# Patient Record
Sex: Female | Born: 1995 | Race: White | Hispanic: No | Marital: Single | Smoking: Never smoker
Health system: Southern US, Community
[De-identification: ages and names within clinical notes are randomized; demographics above are authoritative.]

## PROBLEM LIST (undated history)

## (undated) DIAGNOSIS — J45909 Unspecified asthma, uncomplicated: Secondary | ICD-10-CM

## (undated) DIAGNOSIS — E559 Vitamin D deficiency, unspecified: Secondary | ICD-10-CM

## (undated) DIAGNOSIS — I10 Essential (primary) hypertension: Secondary | ICD-10-CM

## (undated) DIAGNOSIS — E785 Hyperlipidemia, unspecified: Secondary | ICD-10-CM

## (undated) DIAGNOSIS — G43909 Migraine, unspecified, not intractable, without status migrainosus: Secondary | ICD-10-CM

## (undated) HISTORY — DX: Migraine, unspecified, not intractable, without status migrainosus: G43.909

## (undated) HISTORY — DX: Hyperlipidemia, unspecified: E78.5

## (undated) HISTORY — DX: Vitamin D deficiency, unspecified: E55.9

## (undated) HISTORY — DX: Essential (primary) hypertension: I10

## (undated) HISTORY — PX: WISDOM TOOTH EXTRACTION: SHX21

## (undated) HISTORY — DX: Unspecified asthma, uncomplicated: J45.909

---
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
## 2015-01-20 ENCOUNTER — Ambulatory Visit (INDEPENDENT_AMBULATORY_CARE_PROVIDER_SITE_OTHER): Payer: Medicaid Other | Admitting: *Deleted

## 2015-01-20 ENCOUNTER — Encounter: Payer: Self-pay | Admitting: *Deleted

## 2015-01-20 DIAGNOSIS — Z3202 Encounter for pregnancy test, result negative: Secondary | ICD-10-CM | POA: Diagnosis not present

## 2015-01-20 DIAGNOSIS — Z3042 Encounter for surveillance of injectable contraceptive: Secondary | ICD-10-CM | POA: Diagnosis not present

## 2015-01-20 LAB — POCT URINE PREGNANCY: Preg Test, Ur: NEGATIVE

## 2015-01-20 MED ORDER — MEDROXYPROGESTERONE ACETATE 150 MG/ML IM SUSP
150.0000 mg | Freq: Once | INTRAMUSCULAR | Status: AC
  Administered 2015-01-20: 150 mg via INTRAMUSCULAR

## 2015-01-20 NOTE — Progress Notes (Signed)
Pt here for Depo. Pt reports no problems at this time. Return in 12 weeks for next shot. JSY 

## 2015-04-14 ENCOUNTER — Ambulatory Visit (INDEPENDENT_AMBULATORY_CARE_PROVIDER_SITE_OTHER): Payer: Medicaid Other | Admitting: *Deleted

## 2015-04-14 ENCOUNTER — Encounter: Payer: Self-pay | Admitting: *Deleted

## 2015-04-14 DIAGNOSIS — Z3042 Encounter for surveillance of injectable contraceptive: Secondary | ICD-10-CM | POA: Diagnosis not present

## 2015-04-14 DIAGNOSIS — Z3202 Encounter for pregnancy test, result negative: Secondary | ICD-10-CM

## 2015-04-14 LAB — POCT URINE PREGNANCY: Preg Test, Ur: NEGATIVE

## 2015-04-14 MED ORDER — MEDROXYPROGESTERONE ACETATE 150 MG/ML IM SUSP
150.0000 mg | Freq: Once | INTRAMUSCULAR | Status: AC
  Administered 2015-04-14: 150 mg via INTRAMUSCULAR

## 2015-04-14 NOTE — Progress Notes (Signed)
Pt here for Depo. Reports no problems at this time. Return in 12 weeks for next shot. JSY 

## 2015-04-21 ENCOUNTER — Ambulatory Visit (HOSPITAL_COMMUNITY): Payer: Medicaid Other | Admitting: Physical Therapy

## 2015-05-07 ENCOUNTER — Ambulatory Visit (HOSPITAL_COMMUNITY): Payer: Medicaid Other | Attending: Sports Medicine | Admitting: Physical Therapy

## 2015-05-07 DIAGNOSIS — M545 Low back pain, unspecified: Secondary | ICD-10-CM

## 2015-05-07 DIAGNOSIS — R29898 Other symptoms and signs involving the musculoskeletal system: Secondary | ICD-10-CM | POA: Insufficient documentation

## 2015-05-07 NOTE — Therapy (Signed)
[facility] [address] [place], [place], [address] Phone: [phone]   Fax:  [phone]  Physical Therapy Evaluation  Patient Details  Name: Chelcee Korpi MRN: [id number] Date of Birth: 02-May-1996 Referring Provider:  Waldon Reining, MD  Encounter Date: 05/07/2015      PT End of Session - 05/07/15 0837    Visit Number 1   Number of Visits 8   Date for PT Re-Evaluation 06/06/15   Authorization Type medicaid   PT Start Time 0805   PT Stop Time 0840   PT Time Calculation (min) 35 min   Activity Tolerance Patient tolerated treatment well      Past Medical History  Diagnosis Date  . Asthma   . Migraine   . Hypertension   . Hyperlipidemia     No past surgical history on file.  There were no vitals filed for this visit.  Visit Diagnosis:  Bilateral low back pain without sciatica  Left leg weakness      Subjective Assessment - 05/07/15 0807    Subjective Ms. Tufano states that she has had low back pain for about a year now.  The Pain has been progressing and is greater on the Right side.  She complains of numbing in the back of her legs B into the upper thigh.  She has had x-ray which was negative.  She is being referred to PT for evaluation.    How long can you sit comfortably? no problem     How long can you stand comfortably? 10-15 minutes    How long can you walk comfortably? able to walk for an hour or longer    Currently in Pain? Yes   Pain Score 5    Pain Location Back   Pain Descriptors / Indicators Dull   Pain Onset More than a month ago   Pain Frequency Intermittent            [facility] PT Assessment - 05/07/15 0001    Assessment   Medical Diagnosis Low back pain   Onset Date/Surgical Date 03/12/15   Prior Therapy none    Precautions   Precautions None   Restrictions   Weight Bearing Restrictions No   Balance Screen   Has the patient fallen in the past 6 months No   Has the patient had a decrease  in activity level because of a fear of falling?  No   Is the patient reluctant to leave their home because of a fear of falling?  No   Home Environment   Living Environment Private residence   Home Access Level entry   Prior Function   Level of Independence Independent   Warden/ranger;Unemployed   Leisure dance,    Cognition   Overall Cognitive Status Within Functional Limits for tasks assessed   Functional Tests   Functional tests Single leg stance   Single Leg Stance   Comments Rt 41 ; Lt 53   ROM / Strength   AROM / PROM / Strength AROM;Strength   AROM   AROM Assessment Site Lumbar   Lumbar Flexion 110 reps no change   Lumbar Extension 22 reps no change    Lumbar - Right Side Bend 40 no change    Lumbar - Left Side Bend 45 worse with reps    Lumbar - Right Rotation wnl   Lumbar - Left Rotation wnl    Strength   Strength Assessment Site Hip;Knee;Ankle   Right/Left Hip Right;Left  Right Hip Flexion 5/5   Right Hip Extension 4+/5   Right Hip ABduction 5/5   Left Hip Flexion 5/5   Left Hip Extension 3+/5   Left Hip ABduction 5/5   Right/Left Knee Right;Left   Right Knee Flexion 4+/5   Right Knee Extension 5/5   Left Knee Flexion 4/5   Left Knee Extension 5/5   Right/Left Ankle --  wnl B                    [facility] Adult PT Treatment/Exercise - 05/07/15 0001    Exercises   Exercises Lumbar   Lumbar Exercises: Stretches   Active Hamstring Stretch 2 reps;30 seconds   Single Knee to Chest Stretch 2 reps;30 seconds   Pelvic Tilt 5 reps   Lumbar Exercises: Prone   Straight Leg Raise 10 reps   Other Prone Lumbar Exercises heel squeeze x5                 PT Education - 05/07/15 0837    Education provided Yes   Education Details HEP    Person(s) Educated Patient   Methods Explanation;Verbal cues;Handout   Comprehension Verbalized understanding;Returned demonstration          PT Short Term Goals - 05/07/15 0843    PT SHORT TERM GOAL #1    Title I HEP    Time 1   Period Weeks   PT SHORT TERM GOAL #2   Title Pt pain to be no greater than a 5/10    Time 2   Period Weeks           PT Long Term Goals - 05/07/15 0843    PT LONG TERM GOAL #1   Title I in advance HEP   Time 4   Period Weeks   PT LONG TERM GOAL #2   Title Pt to be able to stand for 30 mintues without difficutly for socializing    Time 4   Period Weeks   PT LONG TERM GOAL #3   Title Pt LE strength to be wnl; core ot be 4/5 to allow pain level to be no greater than a 2/10   Time 4   Period Weeks               Plan - 05/07/15 9604    Clinical Impression Statement Ms. Ige is an28 yo female who has had progressive back pain.  She states that it now can go as high as a 8 or 9 therefore she sought medical attention.  Evaluation demonstrates decreased knowledge of body mechanics, decreased Lt LE and core strength.  Ms. Crownover will benefit from skilled physical therapy to educate in an advance HEP to decrease her sx of pain, improve her quality of life and maximize her functioning ability.    Pt will benefit from skilled therapeutic intervention in order to improve on the following deficits Decreased activity tolerance;Decreased strength;Pain;Improper body mechanics   Rehab Potential Good   PT Frequency 2x / week   PT Duration 4 weeks   PT Treatment/Interventions ADLs/Self Care Home Management;Therapeutic activities;Functional mobility training;Therapeutic exercise;Patient/family education;Balance training   PT Next Visit Plan begin single arm raise, prone shoulder extension, opposite arm/leg raise; t-band postural exercises progress to quadriped, yoga poses and lunges.    PT Home Exercise Plan given   Consulted and Agree with Plan of Care Patient         Problem List There are no active problems to display  for this patient. Virgina Organ, PT CLT [phone] 05/07/2015, 8:46 AM  [facility] [address] [place], [place], [address] Phone: [phone]   Fax:  [phone]

## 2015-05-07 NOTE — Patient Instructions (Signed)
Knee-to-Chest Stretch: Unilateral   With hand behind right knee, pull knee in to chest until a comfortable stretch is felt in lower back and buttocks. Keep back relaxed. Hold __30__ seconds. Repeat _3___ times per set. Do __1__ sets per session. Do ___2_ sessions per day.  http://orth.exer.us/126   Copyright  VHI. All rights reserved.  Hamstring Stretch: Active   Support behind right knee. Starting with knee bent, attempt to straighten knee until a comfortable stretch is felt in back of thigh. Hold ___30_ seconds. Repeat __3__ times per set. Do ___1_ sets per session. Do __2__ sessions per day.  http://orth.exer.us/158   Copyright  VHI. All rights reserved.  Pelvic Tilt   Flatten back by tightening stomach muscles and buttocks. Repeat _10___ times per set. Do __1__ sets per session. Do _1___ sessions per day.  http://orth.exer.us/134   Copyright  VHI. All rights reserved.  Heel Squeeze (Prone)   Abdomen supported, bend knees and gently squeeze heels together. Hold ___3-5_ seconds. Repeat _10___ times per set. Do __1__ sets per session. Do _2___ sessions per day.  http://orth.exer.us/1080   Copyright  VHI. All rights reserved.  Straight Leg Raise (Prone)   Abdomen and head supported, keep left knee locked and raise leg at hip. Avoid arching low back. Repeat _10___ times per set. Do _1___ sets per session. Do _2___ sessions per day.  http://orth.exer.us/1112   Copyright  VHI. All rights reserved.

## 2015-05-10 ENCOUNTER — Telehealth (HOSPITAL_COMMUNITY): Payer: Self-pay

## 2015-05-10 ENCOUNTER — Ambulatory Visit (HOSPITAL_COMMUNITY): Payer: Medicaid Other

## 2015-05-10 NOTE — Telephone Encounter (Signed)
No show, called and left message informing missed apt today, informed next apt date and time and contact info.  [address] LPTA; CBIS [phone]

## 2015-05-18 ENCOUNTER — Telehealth (HOSPITAL_COMMUNITY): Payer: Self-pay

## 2015-05-18 ENCOUNTER — Ambulatory Visit (HOSPITAL_COMMUNITY): Payer: Medicaid Other

## 2015-05-18 NOTE — Telephone Encounter (Signed)
Mother said she is sick and can not come in today

## 2015-05-20 ENCOUNTER — Ambulatory Visit (HOSPITAL_COMMUNITY): Payer: Medicaid Other | Attending: Sports Medicine | Admitting: Physical Therapy

## 2015-05-20 DIAGNOSIS — M545 Low back pain, unspecified: Secondary | ICD-10-CM

## 2015-05-20 DIAGNOSIS — R29898 Other symptoms and signs involving the musculoskeletal system: Secondary | ICD-10-CM | POA: Insufficient documentation

## 2015-05-20 NOTE — Therapy (Signed)
[facility] [address] [place], [place], [address] Phone: [phone]   Fax:  [phone]  Physical Therapy Treatment  Patient Details  Name: Crystal Berg MRN: [id number] Date of Birth: [birth year] Referring Provider:  [facility]*  Encounter Date: 05/20/2015      PT End of Session - 05/20/15 1623    Visit Number 2   Number of Visits 8   Date for PT Re-Evaluation 06/06/15   Authorization Type medicaid   PT Start Time 1540   PT Stop Time 1620   PT Time Calculation (min) 40 min   Activity Tolerance Patient tolerated treatment well   Behavior During Therapy [facility] for tasks assessed/performed      Past Medical History  Diagnosis Date  . Asthma   . Migraine   . Hypertension   . Hyperlipidemia     No past surgical history on file.  There were no vitals filed for this visit.  Visit Diagnosis:  Bilateral low back pain without sciatica  Left leg weakness      Subjective Assessment - 05/20/15 1631    Subjective Pt states she has been sick and unable to make her appointments.  PT states her back feels tight like she needs to pop it.  Reports pain of 6/10 when entered and states rarely gets under 5/10.    Currently in Pain? Yes   Pain Score 6    Pain Location Back                         [facility] Adult PT Treatment/Exercise - 05/20/15 1549    Lumbar Exercises: Stretches   Active Hamstring Stretch 2 reps;30 seconds   Single Knee to Chest Stretch 2 reps;30 seconds   Pelvic Tilt Limitations   Pelvic Tilt Limitations 10 reps   Lumbar Exercises: Standing   Scapular Retraction 10 reps;Theraband   Theraband Level (Scapular Retraction) Level 3 (Green)   Row 10 reps;Theraband   Theraband Level (Row) Level 3 (Green)   Shoulder Extension 10 reps;Theraband   Theraband Level (Shoulder Extension) Level 3 (Green)   Lumbar Exercises: Supine   Bridge 10 reps   Straight Leg Raise 10 reps   Lumbar Exercises:  Prone   Single Arm Raise 10 reps   Straight Leg Raise 15 reps   Opposite Arm/Leg Raise 10 reps   Other Prone Lumbar Exercises heel squeeze 15x5                 PT Education - 05/20/15 1623    Education provided Yes   Education Details form and corrections with HEP, new exercise instructions.  Given written copy of HEP with instructions of goals.   Methods Explanation;Demonstration;Handout;Verbal cues   Comprehension Verbalized understanding;Need further instruction          PT Short Term Goals - 05/07/15 0843    PT SHORT TERM GOAL #1   Title I HEP    Time 1   Period Weeks   PT SHORT TERM GOAL #2   Title Pt pain to be no greater than a 5/10    Time 2   Period Weeks           PT Long Term Goals - 05/07/15 0843    PT LONG TERM GOAL #1   Title I in advance HEP   Time 4   Period Weeks   PT LONG TERM GOAL #2   Title Pt to be  able to stand for 30 mintues without difficutly for socializing    Time 4   Period Weeks   PT LONG TERM GOAL #3   Title Pt LE strength to be wnl; core ot be 4/5 to allow pain level to be no greater than a 2/10   Time 4   Period Weeks               Plan - 05/20/15 1624    Clinical Impression Statement Evaluation distributed to patient with review of goals.  Progressed therex per therapist POC.  Pt able to recall 3/5 exercises instructed for HEP.  Pt required therapist facilitation with all therex to complete correctly.  Audible popping in Low back heard with trunk rotation in standing.     PT Next Visit Plan Add  prone shoulder extension next session.  Progress to quadriped, yoga poses and lunges.    PT Home Exercise Plan given   Consulted and Agree with Plan of Care Patient        Problem List There are no active problems to display for this patient.   Lurena Nida, PTA/CLT [phone]  05/20/2015, 4:38 PM  [facility] [address] [place], [place], [address] Phone:  [phone]   Fax:  [phone]

## 2015-05-25 ENCOUNTER — Ambulatory Visit (HOSPITAL_COMMUNITY): Payer: Medicaid Other | Admitting: Physical Therapy

## 2015-05-25 ENCOUNTER — Telehealth (HOSPITAL_COMMUNITY): Payer: Self-pay | Admitting: Physical Therapy

## 2015-05-25 NOTE — Telephone Encounter (Signed)
Patient a no-show for today's appointment. Called and left message detailing time and date of next appointment.   Kristen Unger PT, DPT [phone]  

## 2015-05-27 ENCOUNTER — Ambulatory Visit (HOSPITAL_COMMUNITY): Payer: Medicaid Other | Admitting: Physical Therapy

## 2015-05-27 DIAGNOSIS — M545 Low back pain, unspecified: Secondary | ICD-10-CM

## 2015-05-27 DIAGNOSIS — R29898 Other symptoms and signs involving the musculoskeletal system: Secondary | ICD-10-CM

## 2015-05-27 NOTE — Therapy (Signed)
[facility] [address] [place], [place], [address] Phone: [phone]   Fax:  [phone]  Physical Therapy Treatment  Patient Details  Name: Crystal Berg MRN: [id number] Date of Birth: 1996-08-21 Referring Provider:  [facility]*  Encounter Date: 05/27/2015      PT End of Session - 05/27/15 1640    Visit Number 3   Number of Visits 8   Date for PT Re-Evaluation 06/06/15   Authorization Type medicaid   PT Start Time 1604   PT Stop Time 1648   PT Time Calculation (min) 44 min   Activity Tolerance Patient tolerated treatment well   Behavior During Therapy WFL for tasks assessed/performed      Past Medical History  Diagnosis Date  . Asthma   . Migraine   . Hypertension   . Hyperlipidemia     No past surgical history on file.  There were no vitals filed for this visit.  Visit Diagnosis:  No diagnosis found.      Subjective Assessment - 05/27/15 1614    Subjective Pt states she was in a senior meeting last appointment and could not leave and thats why she didn't make her appointment.  Pt states she has been walking 30 minutes every other day and is currently without pain.  Pt states she did have some pain last night when she layed down but not when she was up moving.    Currently in Pain? No/denies                         [facility] Adult PT Treatment/Exercise - 05/27/15 1616    Lumbar Exercises: Stretches   Active Hamstring Stretch 2 reps;30 seconds   Single Knee to Chest Stretch 2 reps;30 seconds   Lumbar Exercises: Standing   Forward Lunge 10 reps   Forward Lunge Limitations 6" box 1 UE   Side Lunge 10 reps;Limitations   Side Lunge Limitations 6" box 1 UE   Scapular Retraction 10 reps;Theraband   Theraband Level (Scapular Retraction) Level 3 (Green)   Row 10 reps;Theraband   Theraband Level (Row) Level 3 (Green)   Shoulder Extension 10 reps;Theraband   Theraband Level (Shoulder  Extension) Level 3 (Green)   Lumbar Exercises: Supine   Bridge 15 reps   Straight Leg Raise 15 reps   Lumbar Exercises: Prone   Single Arm Raise 10 reps   Straight Leg Raise 10 reps   Opposite Arm/Leg Raise 10 reps   Other Prone Lumbar Exercises heel squeeze 15x5    Other Prone Lumbar Exercises shoulder extensions 10 reps                  PT Short Term Goals - 05/07/15 0843    PT SHORT TERM GOAL #1   Title I HEP    Time 1   Period Weeks   PT SHORT TERM GOAL #2   Title Pt pain to be no greater than a 5/10    Time 2   Period Weeks           PT Long Term Goals - 05/07/15 0843    PT LONG TERM GOAL #1   Title I in advance HEP   Time 4   Period Weeks   PT LONG TERM GOAL #2   Title Pt to be able to stand for 30 mintues without difficutly for socializing    Time 4   Period Weeks  PT LONG TERM GOAL #3   Title Pt LE strength to be wnl; core ot be 4/5 to allow pain level to be no greater than a 2/10   Time 4   Period Weeks               Plan - 05/27/15 1642    Clinical Impression Statement Pt without pain today.  Able to complete all exericises without c/o pain or overage of verbal cues.  No audible popping heard in low back this session.  Able to increase reps and added prone shoulder extension and lunges to POC today.    PT Next Visit Plan Progress prone exercises to quadriped and begin yoga poses.   PT Home Exercise Plan given   Consulted and Agree with Plan of Care Patient        Problem List There are no active problems to display for this patient.   Lurena Nida, PTA/CLT [phone]  05/27/2015, 4:49 PM  [facility] [address] [place], [place], [address] Phone: [phone]   Fax:  [phone]

## 2015-06-01 ENCOUNTER — Ambulatory Visit (HOSPITAL_COMMUNITY): Payer: Medicaid Other | Admitting: Physical Therapy

## 2015-06-10 ENCOUNTER — Encounter (HOSPITAL_COMMUNITY): Payer: Medicaid Other | Admitting: Physical Therapy

## 2015-07-07 ENCOUNTER — Ambulatory Visit: Payer: Medicaid Other

## 2015-07-08 ENCOUNTER — Ambulatory Visit (INDEPENDENT_AMBULATORY_CARE_PROVIDER_SITE_OTHER): Payer: Medicaid Other | Admitting: *Deleted

## 2015-07-08 ENCOUNTER — Encounter: Payer: Self-pay | Admitting: *Deleted

## 2015-07-08 DIAGNOSIS — Z3202 Encounter for pregnancy test, result negative: Secondary | ICD-10-CM | POA: Diagnosis not present

## 2015-07-08 DIAGNOSIS — Z3042 Encounter for surveillance of injectable contraceptive: Secondary | ICD-10-CM

## 2015-07-08 LAB — POCT URINE PREGNANCY: Preg Test, Ur: NEGATIVE

## 2015-07-08 MED ORDER — MEDROXYPROGESTERONE ACETATE 150 MG/ML IM SUSP
150.0000 mg | Freq: Once | INTRAMUSCULAR | Status: AC
  Administered 2015-07-08: 150 mg via INTRAMUSCULAR

## 2015-07-08 NOTE — Progress Notes (Signed)
Pt here for Depo. Reports no problems at this time. Return in 12 weeks for next shot. JSY 

## 2015-09-30 ENCOUNTER — Encounter: Payer: Self-pay | Admitting: *Deleted

## 2015-09-30 ENCOUNTER — Ambulatory Visit (INDEPENDENT_AMBULATORY_CARE_PROVIDER_SITE_OTHER): Payer: Medicaid Other | Admitting: *Deleted

## 2015-09-30 DIAGNOSIS — Z3202 Encounter for pregnancy test, result negative: Secondary | ICD-10-CM

## 2015-09-30 DIAGNOSIS — Z3042 Encounter for surveillance of injectable contraceptive: Secondary | ICD-10-CM

## 2015-09-30 LAB — POCT URINE PREGNANCY: Preg Test, Ur: NEGATIVE

## 2015-09-30 MED ORDER — MEDROXYPROGESTERONE ACETATE 150 MG/ML IM SUSP
150.0000 mg | Freq: Once | INTRAMUSCULAR | Status: AC
  Administered 2015-09-30: 150 mg via INTRAMUSCULAR

## 2015-09-30 NOTE — Progress Notes (Signed)
Pt here for Depo. Pt reports no problems at this time. Return in 12 weeks for next shot. JSY 

## 2015-12-22 ENCOUNTER — Other Ambulatory Visit: Payer: Self-pay | Admitting: Adult Health

## 2015-12-23 ENCOUNTER — Ambulatory Visit (INDEPENDENT_AMBULATORY_CARE_PROVIDER_SITE_OTHER): Payer: Medicaid Other | Admitting: *Deleted

## 2015-12-23 ENCOUNTER — Encounter: Payer: Self-pay | Admitting: *Deleted

## 2015-12-23 DIAGNOSIS — Z3202 Encounter for pregnancy test, result negative: Secondary | ICD-10-CM

## 2015-12-23 DIAGNOSIS — Z3042 Encounter for surveillance of injectable contraceptive: Secondary | ICD-10-CM | POA: Diagnosis not present

## 2015-12-23 LAB — POCT URINE PREGNANCY: PREG TEST UR: NEGATIVE

## 2015-12-23 MED ORDER — MEDROXYPROGESTERONE ACETATE 150 MG/ML IM SUSP
150.0000 mg | Freq: Once | INTRAMUSCULAR | Status: AC
  Administered 2015-12-23: 150 mg via INTRAMUSCULAR

## 2015-12-23 NOTE — Progress Notes (Signed)
Pt here for Depo. Pt tolerated shot well. Return in 12 weeks for next shot. JSY 

## 2016-03-16 ENCOUNTER — Ambulatory Visit (INDEPENDENT_AMBULATORY_CARE_PROVIDER_SITE_OTHER): Payer: Medicaid Other | Admitting: *Deleted

## 2016-03-16 ENCOUNTER — Encounter: Payer: Self-pay | Admitting: *Deleted

## 2016-03-16 DIAGNOSIS — Z3202 Encounter for pregnancy test, result negative: Secondary | ICD-10-CM | POA: Diagnosis not present

## 2016-03-16 DIAGNOSIS — Z3042 Encounter for surveillance of injectable contraceptive: Secondary | ICD-10-CM

## 2016-03-16 DIAGNOSIS — Z3200 Encounter for pregnancy test, result unknown: Secondary | ICD-10-CM

## 2016-03-16 LAB — POCT URINE PREGNANCY: Preg Test, Ur: NEGATIVE

## 2016-03-16 MED ORDER — MEDROXYPROGESTERONE ACETATE 150 MG/ML IM SUSP
150.0000 mg | Freq: Once | INTRAMUSCULAR | Status: AC
  Administered 2016-03-16: 150 mg via INTRAMUSCULAR

## 2016-03-16 NOTE — Progress Notes (Signed)
Patient ID: Crystal Berg, female   DOB: [birth year], 20 y.o.   MRN: [id number] Pt given DEPO injection and tolerated well.

## 2016-06-08 ENCOUNTER — Ambulatory Visit (INDEPENDENT_AMBULATORY_CARE_PROVIDER_SITE_OTHER): Payer: Medicaid Other | Admitting: *Deleted

## 2016-06-08 ENCOUNTER — Encounter: Payer: Self-pay | Admitting: *Deleted

## 2016-06-08 DIAGNOSIS — Z3042 Encounter for surveillance of injectable contraceptive: Secondary | ICD-10-CM | POA: Diagnosis not present

## 2016-06-08 DIAGNOSIS — Z3202 Encounter for pregnancy test, result negative: Secondary | ICD-10-CM | POA: Diagnosis not present

## 2016-06-08 LAB — POCT URINE PREGNANCY: PREG TEST UR: NEGATIVE

## 2016-06-08 MED ORDER — MEDROXYPROGESTERONE ACETATE 150 MG/ML IM SUSP
150.0000 mg | Freq: Once | INTRAMUSCULAR | Status: AC
  Administered 2016-06-08: 150 mg via INTRAMUSCULAR

## 2016-06-08 NOTE — Progress Notes (Signed)
Depo Provera 150 mg IM given left deltoid with no complication, negative pregnancy test. Pt to return in 12 weeks for next injection.  

## 2016-08-31 ENCOUNTER — Ambulatory Visit (INDEPENDENT_AMBULATORY_CARE_PROVIDER_SITE_OTHER): Payer: Medicaid Other

## 2016-08-31 VITALS — Wt 186.0 lb

## 2016-08-31 DIAGNOSIS — Z3202 Encounter for pregnancy test, result negative: Secondary | ICD-10-CM

## 2016-08-31 DIAGNOSIS — Z3042 Encounter for surveillance of injectable contraceptive: Secondary | ICD-10-CM | POA: Diagnosis not present

## 2016-08-31 LAB — POCT URINE PREGNANCY: PREG TEST UR: NEGATIVE

## 2016-08-31 MED ORDER — MEDROXYPROGESTERONE ACETATE 150 MG/ML IM SUSP
150.0000 mg | Freq: Once | INTRAMUSCULAR | Status: AC
  Administered 2016-08-31: 150 mg via INTRAMUSCULAR

## 2016-08-31 NOTE — Progress Notes (Signed)
Pt here for depo shot 150 mg IM given RT Deltoid. Tolerated well. Return 12 week for next shot. P Dones CMA

## 2016-11-23 ENCOUNTER — Encounter: Payer: Self-pay | Admitting: *Deleted

## 2016-11-23 ENCOUNTER — Other Ambulatory Visit: Payer: Self-pay | Admitting: Adult Health

## 2016-11-23 ENCOUNTER — Ambulatory Visit (INDEPENDENT_AMBULATORY_CARE_PROVIDER_SITE_OTHER): Payer: Medicaid Other | Admitting: *Deleted

## 2016-11-23 DIAGNOSIS — Z3202 Encounter for pregnancy test, result negative: Secondary | ICD-10-CM | POA: Diagnosis not present

## 2016-11-23 DIAGNOSIS — Z3042 Encounter for surveillance of injectable contraceptive: Secondary | ICD-10-CM

## 2016-11-23 LAB — POCT URINE PREGNANCY: Preg Test, Ur: NEGATIVE

## 2016-11-23 MED ORDER — MEDROXYPROGESTERONE ACETATE 150 MG/ML IM SUSP
150.0000 mg | Freq: Once | INTRAMUSCULAR | Status: AC
  Administered 2016-11-23: 150 mg via INTRAMUSCULAR

## 2016-11-23 NOTE — Progress Notes (Signed)
Pt here for Depo injection, UPT resulted negative and pt reports no problems at this time.  Injection given in Lt deltoid and pt tolerated well.  Pt instructed to return in 12 weeks for next injection.

## 2017-02-15 ENCOUNTER — Encounter: Payer: Self-pay | Admitting: *Deleted

## 2017-02-15 ENCOUNTER — Ambulatory Visit (INDEPENDENT_AMBULATORY_CARE_PROVIDER_SITE_OTHER): Payer: Medicaid Other | Admitting: *Deleted

## 2017-02-15 DIAGNOSIS — Z3042 Encounter for surveillance of injectable contraceptive: Secondary | ICD-10-CM | POA: Diagnosis not present

## 2017-02-15 DIAGNOSIS — Z3202 Encounter for pregnancy test, result negative: Secondary | ICD-10-CM

## 2017-02-15 LAB — POCT URINE PREGNANCY: PREG TEST UR: NEGATIVE

## 2017-02-15 MED ORDER — MEDROXYPROGESTERONE ACETATE 150 MG/ML IM SUSP
150.0000 mg | Freq: Once | INTRAMUSCULAR | Status: AC
  Administered 2017-02-15: 150 mg via INTRAMUSCULAR

## 2017-02-15 NOTE — Progress Notes (Signed)
Pt given Depo Provera 150mg IM right deltoid without complications. Advised pt to return in 12 weeks for next injection. Pt verbalized understanding. 

## 2017-02-27 ENCOUNTER — Encounter: Payer: Medicaid Other | Admitting: Adult Health

## 2017-03-13 ENCOUNTER — Encounter: Payer: Medicaid Other | Admitting: Adult Health

## 2017-05-10 ENCOUNTER — Ambulatory Visit (INDEPENDENT_AMBULATORY_CARE_PROVIDER_SITE_OTHER): Payer: Medicaid Other

## 2017-05-10 VITALS — Wt 194.0 lb

## 2017-05-10 DIAGNOSIS — Z3202 Encounter for pregnancy test, result negative: Secondary | ICD-10-CM

## 2017-05-10 DIAGNOSIS — Z3042 Encounter for surveillance of injectable contraceptive: Secondary | ICD-10-CM | POA: Diagnosis not present

## 2017-05-10 LAB — POCT URINE PREGNANCY: Preg Test, Ur: NEGATIVE

## 2017-05-10 MED ORDER — MEDROXYPROGESTERONE ACETATE 150 MG/ML IM SUSP
150.0000 mg | Freq: Once | INTRAMUSCULAR | Status: AC
  Administered 2017-05-10: 150 mg via INTRAMUSCULAR

## 2017-05-10 NOTE — Progress Notes (Signed)
PT here for depo shot 150 mg IM given lt deltoid. Tolerated well. Return 12 week for next shot.pad CMA 

## 2017-08-06 ENCOUNTER — Ambulatory Visit: Payer: Medicaid Other

## 2017-08-06 ENCOUNTER — Other Ambulatory Visit: Payer: Self-pay | Admitting: Adult Health

## 2017-08-08 ENCOUNTER — Ambulatory Visit (INDEPENDENT_AMBULATORY_CARE_PROVIDER_SITE_OTHER): Payer: Medicaid Other | Admitting: Adult Health

## 2017-08-08 ENCOUNTER — Other Ambulatory Visit (HOSPITAL_COMMUNITY)
Admission: RE | Admit: 2017-08-08 | Discharge: 2017-08-08 | Disposition: A | Discharge status: Home / Self Care | Payer: Medicaid Other | Source: Ambulatory Visit | Attending: Adult Health | Admitting: Adult Health

## 2017-08-08 ENCOUNTER — Encounter: Payer: Self-pay | Admitting: Adult Health

## 2017-08-08 VITALS — BP 158/98 | HR 80 | Ht 63.5 in | Wt 188.0 lb

## 2017-08-08 DIAGNOSIS — Z309 Encounter for contraceptive management, unspecified: Secondary | ICD-10-CM

## 2017-08-08 DIAGNOSIS — Z3009 Encounter for other general counseling and advice on contraception: Secondary | ICD-10-CM | POA: Insufficient documentation

## 2017-08-08 DIAGNOSIS — Z01419 Encounter for gynecological examination (general) (routine) without abnormal findings: Secondary | ICD-10-CM | POA: Insufficient documentation

## 2017-08-08 DIAGNOSIS — Z3042 Encounter for surveillance of injectable contraceptive: Secondary | ICD-10-CM

## 2017-08-08 DIAGNOSIS — Z113 Encounter for screening for infections with a predominantly sexual mode of transmission: Secondary | ICD-10-CM

## 2017-08-08 MED ORDER — MEDROXYPROGESTERONE ACETATE 150 MG/ML IM SUSP: INTRAMUSCULAR | 4 refills | Status: DC

## 2017-08-08 NOTE — Progress Notes (Signed)
Patient ID: Crystal Berg, female   DOB: [birth year], 21 y.o.   MRN: [id number] History of Present Illness: Crystal Berg is a 21 year old white female in for a well woman gyn exam and first pap. She has family planning medicaid. PCP is [facility].   Current Medications, Allergies, Past Medical History, Past Surgical History, Family History and Social History were reviewed in [facility] electronic medical record.     Review of Systems: Patient denies any headaches, hearing loss, fatigue, blurred vision, shortness of breath, chest pain, abdominal pain, problems with bowel movements, urination, or intercourse. No joint pain or mood swings.    Physical Exam:BP (!) 158/98 (BP Location: Left Arm, Patient Position: Sitting, Cuff Size: Normal)   Pulse 80   Ht 5' 3.5" (1.613 m)   Wt 188 lb (85.3 kg)   BMI 32.78 kg/m  General:  Well developed, well nourished, no acute distress Skin:  Warm and dry Neck:  Midline trachea, normal thyroid, good ROM, no lymphadenopathy Lungs; Clear to auscultation bilaterally Breast:  No dominant palpable mass, retraction, or nipple discharge Cardiovascular: Regular rate and rhythm Abdomen:  Soft, non tender, no hepatosplenomegaly Pelvic:  External genitalia is normal in appearance, no lesions.  The vagina is normal in appearance. Urethra has no lesions or masses. The cervix is smooth, pap with GC/CHL performed.  Uterus is felt to be normal size, shape, and contour.  No adnexal masses or tenderness noted.Bladder is non tender, no masses felt. Extremities/musculoskeletal:  No swelling or varicosities noted, no clubbing or cyanosis Psych:  No mood changes, alert and cooperative,seems happy PHQ 2 score 0   Impression: 1. Encounter for gynecological examination with Papanicolaou smear of cervix   2. Family planning   3. Surveillance for Depo-Provera contraception   4. Screening examination for STD (sexually transmitted disease)       Plan:  Meds ordered this  encounter  Medications  . medroxyPROGESTERone (DEPO-PROVERA) 150 MG/ML injection    Sig: INJECT I.M. 1ML EVERY 3 MONTHS.    Dispense:  1 mL    Refill:  4    Order Specific Question:   Supervising Provider    Answer:   Duane LopeEURE, LUTHER H [[id number]]  Return in 1 day for depo Check HIV and RPR Physical in 1 year Pap in 3 if normal

## 2017-08-09 ENCOUNTER — Ambulatory Visit (INDEPENDENT_AMBULATORY_CARE_PROVIDER_SITE_OTHER): Payer: Medicaid Other | Admitting: *Deleted

## 2017-08-09 DIAGNOSIS — Z3202 Encounter for pregnancy test, result negative: Secondary | ICD-10-CM

## 2017-08-09 DIAGNOSIS — Z3042 Encounter for surveillance of injectable contraceptive: Secondary | ICD-10-CM | POA: Diagnosis not present

## 2017-08-09 LAB — CYTOLOGY - PAP
ADEQUACY: ABSENT
CHLAMYDIA, DNA PROBE: NEGATIVE
DIAGNOSIS: NEGATIVE
NEISSERIA GONORRHEA: NEGATIVE

## 2017-08-09 LAB — HIV ANTIBODY (ROUTINE TESTING W REFLEX): HIV Screen 4th Generation wRfx: NONREACTIVE

## 2017-08-09 LAB — POCT URINE PREGNANCY: PREG TEST UR: NEGATIVE

## 2017-08-09 LAB — RPR: RPR Ser Ql: NONREACTIVE

## 2017-08-09 MED ORDER — MEDROXYPROGESTERONE ACETATE 150 MG/ML IM SUSP
150.0000 mg | Freq: Once | INTRAMUSCULAR | Status: AC
  Administered 2017-08-09: 150 mg via INTRAMUSCULAR

## 2017-08-09 NOTE — Progress Notes (Signed)
Pt in for depo provera injection. UPT is negative. Next depo due 2/14-2/28

## 2017-11-01 ENCOUNTER — Ambulatory Visit (INDEPENDENT_AMBULATORY_CARE_PROVIDER_SITE_OTHER): Payer: 59 | Admitting: *Deleted

## 2017-11-01 ENCOUNTER — Other Ambulatory Visit: Payer: Self-pay

## 2017-11-01 ENCOUNTER — Encounter: Payer: Self-pay | Admitting: *Deleted

## 2017-11-01 DIAGNOSIS — Z3042 Encounter for surveillance of injectable contraceptive: Secondary | ICD-10-CM | POA: Diagnosis not present

## 2017-11-01 DIAGNOSIS — Z3202 Encounter for pregnancy test, result negative: Secondary | ICD-10-CM

## 2017-11-01 LAB — POCT URINE PREGNANCY: PREG TEST UR: NEGATIVE

## 2017-11-01 MED ORDER — MEDROXYPROGESTERONE ACETATE 150 MG/ML IM SUSP
150.0000 mg | Freq: Once | INTRAMUSCULAR | Status: AC
  Administered 2017-11-01: 150 mg via INTRAMUSCULAR

## 2017-11-01 NOTE — Progress Notes (Signed)
Pt given DepoProvera 150mg IM right deltoid without complications. Advised pt to return in 12 weeks for next injection.  

## 2018-01-24 ENCOUNTER — Ambulatory Visit: Payer: 59

## 2018-01-28 ENCOUNTER — Ambulatory Visit (INDEPENDENT_AMBULATORY_CARE_PROVIDER_SITE_OTHER): Payer: 59

## 2018-01-28 VITALS — Ht 65.0 in | Wt 188.0 lb

## 2018-01-28 DIAGNOSIS — Z3202 Encounter for pregnancy test, result negative: Secondary | ICD-10-CM | POA: Diagnosis not present

## 2018-01-28 DIAGNOSIS — Z3042 Encounter for surveillance of injectable contraceptive: Secondary | ICD-10-CM

## 2018-01-28 LAB — POCT URINE PREGNANCY: PREG TEST UR: NEGATIVE

## 2018-01-28 MED ORDER — MEDROXYPROGESTERONE ACETATE 150 MG/ML IM SUSP
150.0000 mg | Freq: Once | INTRAMUSCULAR | Status: AC
  Administered 2018-01-28: 150 mg via INTRAMUSCULAR

## 2018-01-28 NOTE — Progress Notes (Signed)
Pt here for depo shot 150 mg given IM Lt deltoid. Tolerated well. Return 12 week for next injection. Pad CMA.

## 2018-04-22 ENCOUNTER — Ambulatory Visit: Payer: 59

## 2018-04-24 ENCOUNTER — Ambulatory Visit (INDEPENDENT_AMBULATORY_CARE_PROVIDER_SITE_OTHER): Payer: 59

## 2018-04-24 VITALS — Ht 63.0 in | Wt 187.4 lb

## 2018-04-24 DIAGNOSIS — Z3042 Encounter for surveillance of injectable contraceptive: Secondary | ICD-10-CM | POA: Diagnosis not present

## 2018-04-24 DIAGNOSIS — Z3202 Encounter for pregnancy test, result negative: Secondary | ICD-10-CM | POA: Diagnosis not present

## 2018-04-24 LAB — POCT URINE PREGNANCY: PREG TEST UR: NEGATIVE

## 2018-04-24 MED ORDER — MEDROXYPROGESTERONE ACETATE 150 MG/ML IM SUSP
150.0000 mg | Freq: Once | INTRAMUSCULAR | Status: AC
Start: 2018-04-24 — End: 2018-04-24
  Administered 2018-04-24: 150 mg via INTRAMUSCULAR

## 2018-04-24 NOTE — Progress Notes (Signed)
Pt here for depo injection 150 mg IM given rt deltoid. Tolerated well. Return 12 weeks for next injection. Pad CMA 

## 2018-06-18 ENCOUNTER — Encounter (HOSPITAL_COMMUNITY): Payer: Self-pay | Admitting: Physical Therapy

## 2018-06-18 NOTE — Therapy (Signed)
[facility], [place], [address] Phone: [phone]   Fax:  [phone]  Patient Details  Name: Crystal Berg MRN: [id number] Date of Birth: 1996/08/04 Referring Provider:  Leonie Douglas   Encounter Date: 06/18/2018 PHYSICAL THERAPY DISCHARGE SUMMARY  Visits from Start of Care: 4  Current functional level related to goals / functional outcome  Unknown as pt did not return  Remaining deficits: Unknown as pt did not return    Education / Equipment: HEP Plan: Patient agrees to discharge.  Patient goals were partially met. Patient is being discharged due to not returning since the last visit.  ?????    Rayetta Humphrey, PT CLT [phone] 06/18/2018, 2:05 PM  [facility] [address] [place], [place], [address] Phone: [phone]   Fax:  [phone]

## 2018-07-17 ENCOUNTER — Ambulatory Visit (INDEPENDENT_AMBULATORY_CARE_PROVIDER_SITE_OTHER): Payer: 59

## 2018-07-17 VITALS — Ht 63.0 in | Wt 186.0 lb

## 2018-07-17 DIAGNOSIS — Z3042 Encounter for surveillance of injectable contraceptive: Secondary | ICD-10-CM | POA: Diagnosis not present

## 2018-07-17 DIAGNOSIS — Z3202 Encounter for pregnancy test, result negative: Secondary | ICD-10-CM

## 2018-07-17 LAB — POCT URINE PREGNANCY: Preg Test, Ur: NEGATIVE

## 2018-07-17 MED ORDER — MEDROXYPROGESTERONE ACETATE 150 MG/ML IM SUSP
150.0000 mg | Freq: Once | INTRAMUSCULAR | Status: AC
  Administered 2018-07-17: 150 mg via INTRAMUSCULAR

## 2018-07-17 NOTE — Progress Notes (Signed)
Pt here for depo injection 150 mg IM given lt deltoid. Tolerated well.Return 12 weeks for next injection. Pad CMA 

## 2018-10-08 ENCOUNTER — Other Ambulatory Visit: Payer: Self-pay | Admitting: Adult Health

## 2018-10-09 ENCOUNTER — Ambulatory Visit (INDEPENDENT_AMBULATORY_CARE_PROVIDER_SITE_OTHER): Payer: Medicaid Other | Admitting: *Deleted

## 2018-10-09 DIAGNOSIS — Z3042 Encounter for surveillance of injectable contraceptive: Secondary | ICD-10-CM | POA: Diagnosis not present

## 2018-10-09 LAB — POCT URINE PREGNANCY: PREG TEST UR: NEGATIVE

## 2018-10-09 MED ORDER — MEDROXYPROGESTERONE ACETATE 150 MG/ML IM SUSP
150.0000 mg | Freq: Once | INTRAMUSCULAR | Status: AC
  Administered 2018-10-09: 150 mg via INTRAMUSCULAR

## 2018-10-09 NOTE — Progress Notes (Signed)
Depo Provera 150 mg given IM in right deltoid.  

## 2018-12-26 ENCOUNTER — Telehealth: Payer: Self-pay | Admitting: Obstetrics & Gynecology

## 2018-12-26 NOTE — Telephone Encounter (Signed)
Spoke with pt letting her know she needs an in person visit per Selena Batten B. Advised to schedule an appt. Pt voiced understanding. JSY

## 2018-12-26 NOTE — Telephone Encounter (Signed)
Patient called, stated that she has a knot under her arm/armpit, going into her chest, making her chest and breast hurt x two weeks or more, but getting worse causing her to not sleep.  She's not sure if she pulled something at work or if it's something else.  She called her primary doctor and they told her that she needed to come here.  Please advise.  [employer]  [phone]

## 2018-12-27 ENCOUNTER — Other Ambulatory Visit: Payer: Self-pay

## 2018-12-27 ENCOUNTER — Encounter: Payer: Self-pay | Admitting: Adult Health

## 2018-12-27 ENCOUNTER — Ambulatory Visit: Payer: BLUE CROSS/BLUE SHIELD | Admitting: Adult Health

## 2018-12-27 VITALS — BP 131/88 | HR 74 | Temp 98.6°F | Ht 63.0 in | Wt 190.0 lb

## 2018-12-27 DIAGNOSIS — N6310 Unspecified lump in the right breast, unspecified quadrant: Secondary | ICD-10-CM | POA: Diagnosis not present

## 2018-12-27 DIAGNOSIS — N644 Mastodynia: Secondary | ICD-10-CM

## 2018-12-27 NOTE — Patient Instructions (Signed)
Fibrocystic Breast Changes  Fibrocystic breast changes are changes that can make your breasts swollen or painful. These changes happen when tiny sacs of fluid (cysts) form in the breast. This is a common condition. It does not mean that you have cancer. It usually happens because of hormone changes during a monthly period. Follow these instructions at home:  Check your breasts after every monthly period. If you do not have monthly periods, check your breasts on the first day of every month. Check for: ? Soreness. ? New swelling or puffiness. ? A change in breast size. ? A change in a lump that was already there.  Take over-the-counter and prescription medicines only as told by your doctor.  Wear a support or sports bra that fits well. Wear this support especially when you are exercising.  Avoid or have less caffeine, fat, and sugar in what you eat and drink as told by your doctor. Contact a doctor if:  You have fluid coming from your nipple, especially if the fluid has blood in it.  You have new lumps or bumps in your breast.  Your breast gets puffy, red, and painful.  You have changes in how your breast looks.  Your nipple looks flat or it sinks into your breast. Get help right away if:  Your breast turns red, and the redness is spreading. Summary  Fibrocystic breast changes are changes that can make your breasts swollen or painful.  This condition can happen when you have hormone changes during your monthly period.  With this condition, it is important to check your breasts after every monthly period. If you do not have monthly periods, check your breasts on the first day of every month. This information is not intended to replace advice given to you by your health care provider. Make sure you discuss any questions you have with your health care provider. Document Released: 08/10/2008 Document Revised: 05/11/2016 Document Reviewed: 05/11/2016 Elsevier Interactive Patient  Education  2019 Elsevier Inc. Breast Tenderness Breast tenderness is a common problem for women of all ages. Breast tenderness may cause mild discomfort to severe pain. The pain usually comes and goes in association with your menstrual cycle, but it can be constant. Breast tenderness has many possible causes, including hormone changes and some medicines. Your health care provider may order tests, such as a mammogram or an ultrasound, to check for any unusual findings. Having breast tenderness usually does not mean that you have breast cancer. Follow these instructions at home: Sometimes, reassurance that you do not have breast cancer is all that is needed. In general, follow these home care instructions: Managing pain and discomfort   If directed, apply ice to the area: ? Put ice in a plastic bag. ? Place a towel between your skin and the bag. ? Leave the ice on for 20 minutes, 2-3 times a day.  Make sure you are wearing a supportive bra, especially during exercise. You may also want to wear a supportive bra while sleeping if your breasts are very tender. Medicines  Take over-the-counter and prescription medicines only as told by your health care provider. If the cause of your pain is infection, you may be prescribed an antibiotic medicine.  If you were prescribed an antibiotic, take it as told by your health care provider. Do not stop taking the antibiotic even if you start to feel better. General instructions   Your health care provider may recommend that you reduce the amount of fat in your diet. You  can do this by: ? Limiting fried foods. ? Cooking foods using methods, such as baking, boiling, grilling, and broiling.  Decrease the amount of caffeine in your diet. You can do this by drinking more water and choosing caffeine-free options.  Keep a log of the days and times when your breasts are most tender.  Ask your health care provider how to do breast exams at home. This will help  you notice if you have an unusual growth or lump. Contact a health care provider if:  Any part of your breast is hard, red, and hot to the touch. This may be a sign of infection.  You are not breastfeeding and you have fluid, especially blood or pus, coming out of your nipples.  You have a fever.  You have a new or painful lump in your breast that remains after your menstrual period ends.  Your pain does not improve or it gets worse.  Your pain is interfering with your daily activities. This information is not intended to replace advice given to you by your health care provider. Make sure you discuss any questions you have with your health care provider. Document Released: 08/10/2008 Document Revised: 05/26/2016 Document Reviewed: 05/26/2016 Elsevier Interactive Patient Education  2019 [employer]

## 2018-12-27 NOTE — Progress Notes (Signed)
Patient ID: Crystal Berg, female   DOB: [birth year], 23 y.o.   MRN: [id number] History of Present Illness: Crystal Berg is a 23 year old white female,single, G0P0,on depo, in complaining of pain in right breast and ?lump near under arm.She says it hurts to lat on her right side and has been happening about 2-3 weeks now. PCP is [facility].    Current Medications, Allergies, Past Medical History, Past Surgical History, Family History and Social History were reviewed in [employer] record.     Review of Systems: +pain in right breast for about 2-3 weeks, extends to under arm, ?lump Hurts to lay on right side  She does lift boxes at work    Physical Exam:BP 131/88 (BP Location: Left Arm, Patient Position: Sitting, Cuff Size: Normal)   Pulse 74   Temp 98.6 F (37 C)   Ht 5\' 3"  (1.6 m)   Wt 190 lb (86.2 kg)   BMI 33.66 kg/m  General:  Well developed, well nourished, no acute distress Skin:  Warm and dry Breast:  No dominant palpable mass, retraction, or nipple discharge on left, on right, no retraction or nipple discharge but has 1 cm, tender mass at 12 o'clock in right breast 3 FB from nipple and has regular irregularities both breast >on right  Psych:  No mood changes, alert and cooperative,seems happy Fall risk is low. Will get [place] of right breast. Discussed could be cyst, or fibrocystic changes, and she could have pulled muscle too, lifting boxes.   Impression: 1. Breast mass, right - US BREAST LTD UNI RIGHT INC AXILLA; 4/21 at 11:40 am at [facility]  2. Pain in breast - US BREAST LTD UNI RIGHT INC AXILLA;     Plan: [place] right breast 4/21 at 11:40 at [facility] Review handouts on breast pain and fibrocystic changes of the breast  To get next depo 4/22

## 2018-12-31 ENCOUNTER — Ambulatory Visit (HOSPITAL_COMMUNITY)
Admission: RE | Admit: 2018-12-31 | Discharge: 2018-12-31 | Disposition: A | Discharge status: Home / Self Care | Payer: BLUE CROSS/BLUE SHIELD | Source: Ambulatory Visit | Attending: Adult Health | Admitting: Adult Health

## 2018-12-31 ENCOUNTER — Other Ambulatory Visit: Payer: Self-pay

## 2018-12-31 ENCOUNTER — Other Ambulatory Visit: Payer: Self-pay | Admitting: Adult Health

## 2018-12-31 DIAGNOSIS — N644 Mastodynia: Secondary | ICD-10-CM | POA: Diagnosis present

## 2018-12-31 DIAGNOSIS — N6310 Unspecified lump in the right breast, unspecified quadrant: Secondary | ICD-10-CM | POA: Diagnosis present

## 2019-01-01 ENCOUNTER — Other Ambulatory Visit: Payer: Self-pay

## 2019-01-01 ENCOUNTER — Ambulatory Visit (INDEPENDENT_AMBULATORY_CARE_PROVIDER_SITE_OTHER): Payer: BLUE CROSS/BLUE SHIELD | Admitting: *Deleted

## 2019-01-01 DIAGNOSIS — Z3042 Encounter for surveillance of injectable contraceptive: Secondary | ICD-10-CM

## 2019-01-01 MED ORDER — MEDROXYPROGESTERONE ACETATE 150 MG/ML IM SUSP
150.0000 mg | Freq: Once | INTRAMUSCULAR | Status: AC
  Administered 2019-01-01: 150 mg via INTRAMUSCULAR

## 2019-01-01 NOTE — Progress Notes (Signed)
Depo Provera 150mg IM given in left deltoid with no complications. Pt to return in 12 weeks for next injection.  

## 2019-03-26 ENCOUNTER — Other Ambulatory Visit: Payer: Self-pay

## 2019-03-26 ENCOUNTER — Ambulatory Visit (INDEPENDENT_AMBULATORY_CARE_PROVIDER_SITE_OTHER): Payer: BLUE CROSS/BLUE SHIELD | Admitting: *Deleted

## 2019-03-26 DIAGNOSIS — Z3042 Encounter for surveillance of injectable contraceptive: Secondary | ICD-10-CM

## 2019-03-26 MED ORDER — MEDROXYPROGESTERONE ACETATE 150 MG/ML IM SUSP
150.0000 mg | Freq: Once | INTRAMUSCULAR | Status: AC
  Administered 2019-03-26: 150 mg via INTRAMUSCULAR

## 2019-03-26 NOTE — Progress Notes (Signed)
Depo Provera 150mg IM given in left deltoid with no complications. Pt to return in 12 weeks for next injection.  

## 2019-06-26 ENCOUNTER — Other Ambulatory Visit: Payer: Self-pay

## 2019-06-26 ENCOUNTER — Ambulatory Visit (INDEPENDENT_AMBULATORY_CARE_PROVIDER_SITE_OTHER): Payer: Medicaid Other | Admitting: *Deleted

## 2019-06-26 VITALS — BP 131/93 | HR 66 | Ht 64.0 in | Wt 194.0 lb

## 2019-06-26 DIAGNOSIS — Z3042 Encounter for surveillance of injectable contraceptive: Secondary | ICD-10-CM

## 2019-06-26 MED ORDER — MEDROXYPROGESTERONE ACETATE 150 MG/ML IM SUSP
150.0000 mg | Freq: Once | INTRAMUSCULAR | Status: AC
  Administered 2019-06-26: 150 mg via INTRAMUSCULAR

## 2019-06-26 NOTE — Progress Notes (Signed)
   NURSE VISIT- INJECTION  SUBJECTIVE:  Crystal Berg is a 23 y.o. G0P0000 female here for a Depo Provera for contraception/period management. She is a GYN patient.   OBJECTIVE:  BP (!) 131/93 (BP Location: Left Arm, Patient Position: Sitting, Cuff Size: Normal)   Pulse 66   Ht 5\' 4"  (1.626 m)   Wt 194 lb (88 kg)   BMI 33.30 kg/m   Appears well, in no apparent distress  Injection administered in: Left deltoid  No orders of the defined types were placed in this encounter.   ASSESSMENT: GYN patient Depo Provera for contraception/period management  PLAN: Follow-up: in 11-13 weeks for next Depo   Rolena Infante  06/26/2019 11:22 AM

## 2019-08-06 ENCOUNTER — Emergency Department (HOSPITAL_COMMUNITY)
Admission: EM | Admit: 2019-08-06 | Discharge: 2019-08-06 | Disposition: A | Discharge status: Home / Self Care | Payer: Medicaid Other | Attending: Emergency Medicine | Admitting: Emergency Medicine

## 2019-08-06 ENCOUNTER — Encounter (HOSPITAL_COMMUNITY): Payer: Self-pay | Admitting: *Deleted

## 2019-08-06 ENCOUNTER — Other Ambulatory Visit: Payer: Self-pay

## 2019-08-06 DIAGNOSIS — Y93E8 Activity, other personal hygiene: Secondary | ICD-10-CM | POA: Insufficient documentation

## 2019-08-06 DIAGNOSIS — S0101XA Laceration without foreign body of scalp, initial encounter: Secondary | ICD-10-CM

## 2019-08-06 DIAGNOSIS — W01198A Fall on same level from slipping, tripping and stumbling with subsequent striking against other object, initial encounter: Secondary | ICD-10-CM | POA: Insufficient documentation

## 2019-08-06 DIAGNOSIS — Z91040 Latex allergy status: Secondary | ICD-10-CM | POA: Insufficient documentation

## 2019-08-06 DIAGNOSIS — J45909 Unspecified asthma, uncomplicated: Secondary | ICD-10-CM | POA: Insufficient documentation

## 2019-08-06 DIAGNOSIS — Y999 Unspecified external cause status: Secondary | ICD-10-CM | POA: Insufficient documentation

## 2019-08-06 DIAGNOSIS — Y92002 Bathroom of unspecified non-institutional (private) residence single-family (private) house as the place of occurrence of the external cause: Secondary | ICD-10-CM | POA: Insufficient documentation

## 2019-08-06 DIAGNOSIS — I10 Essential (primary) hypertension: Secondary | ICD-10-CM | POA: Insufficient documentation

## 2019-08-06 DIAGNOSIS — Z79899 Other long term (current) drug therapy: Secondary | ICD-10-CM | POA: Insufficient documentation

## 2019-08-06 MED ORDER — LIDOCAINE-EPINEPHRINE (PF) 1 %-1:200000 IJ SOLN
10.0000 mL | Freq: Once | INTRAMUSCULAR | Status: AC
  Administered 2019-08-06: 10 mL
  Filled 2019-08-06: qty 30

## 2019-08-06 NOTE — ED Triage Notes (Signed)
Pt states she got out of the shower and when she was drying her hair she lost her balance and fell backwards striking the back of her head on the tub; pt has a small laceration with a hematoma to the middle of her head

## 2019-08-06 NOTE — ED Provider Notes (Signed)
[facility]GENCY [facility]PART[facility] Provider Note   CSN: [id number] Arrival date & time: 08/06/19  0141     History   Chief Complaint Chief Complaint  Patient presents with   Fall    HPI Crystal Berg is a 23 y.o. female.     Patient presents to the emergency department for evaluation of head laceration.  Patient reports that she was drying her hair after a shower when she lost her balance and fell backwards, hitting the back of her head on the shower.  There was no loss of consciousness.  She has a mild headache, no neck pain.  No other injury.  Fall occurred approximately 3 hours ago.     Past Medical History:  Diagnosis Date   Asthma    Hyperlipidemia    Hypertension    Migraine     Patient Active Problem List   Diagnosis Date Noted   Family planning 08/08/2017   Encounter for gynecological examination with Papanicolaou smear of cervix 08/08/2017    Past Surgical History:  Procedure Laterality Date   WISDOM TOOTH EXTRACTION       OB History    Gravida  0   Para  0   Term  0   Preterm  0   AB  0   Living  0     SAB  0   TAB  0   Ectopic  0   Multiple  0   Live Births  0            Home Medications    Prior to Admission medications   Medication Sig Start Date End Date Taking? Authorizing Provider  albuterol (PROVENTIL HFA;VENTOLIN HFA) 108 (90 BASE) MCG/ACT inhaler Inhale 2 puffs into the lungs every 6 (six) hours as needed for wheezing.    [provider]  albuterol (PROVENTIL) (2.5 MG/3ML) 0.083% nebulizer solution Take 2.5 mg by nebulization as needed for wheezing or shortness of breath.    [provider]  hydrochlorothiazide (HYDRODIURIL) 25 MG tablet Take 25 mg by mouth daily.    [provider]  lisinopril-hydrochlorothiazide (PRINZIDE,ZESTORETIC) 20-25 MG tablet Take 1 tablet by mouth daily.    [provider]  medroxyPROGESTERone (DEPO-PROVERA) 150 MG/ML injection INJECT I.M. 1ML  EVERY 3 MONTHS. 12/31/18   Adline PotterGriffin, Jennifer A, NP  omeprazole (PRILOSEC) 40 MG capsule Take 40 mg by mouth daily.    [provider]    Family History Family History  Problem Relation Age of Onset   Heart disease Mother    Diabetes Mother    Asthma Mother    Cancer Maternal Grandmother        leukemia   Cancer Maternal Aunt        ovarian   Cancer Paternal Grandfather        throat,face   Asthma Paternal Grandmother    Heart disease Maternal Grandfather    Hypertension Father     Social History Social History   Tobacco Use   Smoking status: Never Smoker   Smokeless tobacco: Never Used  Substance Use Topics   Alcohol use: No   Drug use: No     Allergies   Amoxicillin, Latex, Other, Penicillins, and Mushroom extract complex   Review of Systems Review of Systems  Musculoskeletal: Negative for neck pain.  Skin: Positive for wound.  Neurological: Positive for headaches.     Physical Exam Updated Vital Signs BP (!) 135/93 (BP Location: Right Arm)    Pulse 84  Temp 98 F (36.7 C) (Oral)    Resp 18    Ht 5\' 3"  (1.6 m)    Wt 86.2 kg    SpO2 100%    BMI 33.66 kg/m   Physical Exam Vitals signs and nursing note reviewed.  Constitutional:      General: She is not in acute distress.    Appearance: Normal appearance. She is well-developed.  HENT:     Head: Normocephalic. Contusion and laceration present.      Right Ear: Hearing normal.     Left Ear: Hearing normal.     Nose: Nose normal.  Eyes:     Conjunctiva/sclera: Conjunctivae normal.     Pupils: Pupils are equal, round, and reactive to light.  Neck:     Musculoskeletal: Normal range of motion and neck supple. No spinous process tenderness or muscular tenderness.  Cardiovascular:     Rate and Rhythm: Regular rhythm.     Heart sounds: S1 normal and S2 normal. No murmur. No friction rub. No gallop.   Pulmonary:     Effort: Pulmonary effort is normal. No respiratory distress.      Breath sounds: Normal breath sounds.  Chest:     Chest wall: No tenderness.  Abdominal:     General: Bowel sounds are normal.     Palpations: Abdomen is soft.     Tenderness: There is no abdominal tenderness. There is no guarding or rebound. Negative signs include Murphy's sign and McBurney's sign.     Hernia: No hernia is present.  Musculoskeletal: Normal range of motion.  Skin:    General: Skin is warm and dry.     Findings: Laceration (post scalp 2cm) present. No rash.  Neurological:     Mental Status: She is alert and oriented to person, place, and time.     GCS: GCS eye subscore is 4. GCS verbal subscore is 5. GCS motor subscore is 6.     Cranial Nerves: No cranial nerve deficit.     Sensory: No sensory deficit.     Coordination: Coordination normal.  Psychiatric:        Speech: Speech normal.        Behavior: Behavior normal.        Thought Content: Thought content normal.      ED Treatments / Results  Labs (all labs ordered are listed, but only abnormal results are displayed) Labs Reviewed - No data to display  EKG None  Radiology No results found.  Procedures . Laceration Repair  Date/Time: 08/06/2019 3:47 AM Performed by: 08/08/2019, MD Authorized by: Gilda Crease, MD   Consent:    Consent obtained:  Verbal   Consent given by:  Patient   Risks discussed:  Pain Universal protocol:    Procedure explained and questions answered to patient or proxy's satisfaction: yes     Relevant documents present and verified: yes     Required blood products, implants, devices, and special equipment available: yes     Site/side marked: yes     Immediately prior to procedure, a time out was called: yes     Patient identity confirmed:  Verbally with patient Anesthesia (see MAR for exact dosages):    Anesthesia method:  Local infiltration   Local anesthetic:  Lidocaine 1% WITH epi Laceration details:    Location:  Scalp   Scalp location:   Occipital   Length (cm):  2 Repair type:    Repair type:  Simple Pre-procedure details:  Preparation:  Patient was prepped and draped in usual sterile fashion Exploration:    Hemostasis achieved with:  Direct pressure and epinephrine   Contaminated: no   Treatment:    Area cleansed with:  Hibiclens   Irrigation solution:  Sterile saline   Irrigation method:  Syringe Skin repair:    Repair method:  Staples   Number of staples:  3 Approximation:    Approximation:  Close Post-procedure details:    Dressing:  Open (no dressing)   Patient tolerance of procedure:  Tolerated well, no immediate complications   (including critical care time)  Medications Ordered in ED Medications  lidocaine-EPINEPHrine (XYLOCAINE-EPINEPHrine) 1 %-1:200000 (PF) injection 10 mL (has no administration in time range)     Initial Impression / Assessment and Plan / ED Course  I have reviewed the triage vital signs and the nursing notes.  Pertinent labs & imaging results that were available during my care of the patient were reviewed by me and considered in my medical decision making (see chart for details).        Patient presents to the emergency department for evaluation after a fall.  Patient fell backwards after losing her balance and hit her head on the shower stall.  She suffered a laceration of the posterior scalp in the area of the occiput.  She did not lose consciousness before or after the fall.  She has a very slight headache but has normal neurologic function.  Patient seen 3 hours after the fall.  Patient is felt to be very low risk for any intracranial injury, no imaging recommended.  No other injury noted.  Wound stapled, will need staple removal in 10 days.  Final Clinical Impressions(s) / ED Diagnoses   Final diagnoses:  Laceration of scalp, initial encounter    ED Discharge Orders    None       Linder Prajapati, Gwenyth Allegra, MD 08/06/19 [phone]

## 2019-09-10 ENCOUNTER — Telehealth: Payer: Self-pay | Admitting: Obstetrics & Gynecology

## 2019-09-10 NOTE — Telephone Encounter (Signed)
Called patient regarding appointment scheduled in our office and advised to come alone to the visit, however, a support person, over age 23, may accompany her to appointment if assistance is needed for safety or care concerns. Otherwise, support persons should remain outside until the visit is complete.   Prescreen questions asked: 1. Any of the following symptoms of COVID such as chills, fever, cough, shortness of breath, muscle pain, diarrhea, rash, vomiting, abdominal pain, red eye, weakness, bruising, bleeding, joint pain, loss of taste or smell, a severe headache, sore throat, fatigue 2. Any exposure to anyone suspected or confirmed of having COVID-19 3. Awaiting test results for COVID-19  Also,to keep you safe, please use the provided hand sanitizer when you enter the office. We are asking everyone in the office to wear a mask to help prevent the spread of germs. If you have a mask of your own, please wear it to your appointment, if not, we are happy to provide one for you.  Thank you for understanding and your cooperation.    [facility] Staff      

## 2019-09-15 ENCOUNTER — Ambulatory Visit (INDEPENDENT_AMBULATORY_CARE_PROVIDER_SITE_OTHER): Payer: Medicaid Other | Admitting: *Deleted

## 2019-09-15 ENCOUNTER — Other Ambulatory Visit: Payer: Self-pay

## 2019-09-15 DIAGNOSIS — Z3042 Encounter for surveillance of injectable contraceptive: Secondary | ICD-10-CM | POA: Diagnosis not present

## 2019-09-15 MED ORDER — MEDROXYPROGESTERONE ACETATE 150 MG/ML IM SUSP
150.0000 mg | Freq: Once | INTRAMUSCULAR | Status: AC
  Administered 2019-09-15: 10:00:00 150 mg via INTRAMUSCULAR

## 2019-09-15 NOTE — Progress Notes (Signed)
   NURSE VISIT- INJECTION  SUBJECTIVE:  Crystal Berg is a 24 y.o. G0P0000 female here for a Depo Provera for contraception/period management. She is a GYN patient.   OBJECTIVE:  There were no vitals taken for this visit.  Appears well, in no apparent distress  Injection administered in: Right deltoid  No orders of the defined types were placed in this encounter.   ASSESSMENT: GYN patient Depo Provera for contraception/period management PLAN: Follow-up: in 11-13 weeks for next Depo   Alee Gressman, Faith Rogue  09/15/2019 10:25 AM

## 2019-12-05 ENCOUNTER — Other Ambulatory Visit: Payer: Self-pay | Admitting: Adult Health

## 2019-12-08 ENCOUNTER — Ambulatory Visit: Payer: Medicaid Other

## 2019-12-10 ENCOUNTER — Ambulatory Visit (INDEPENDENT_AMBULATORY_CARE_PROVIDER_SITE_OTHER): Payer: BC Managed Care – PPO | Admitting: *Deleted

## 2019-12-10 ENCOUNTER — Other Ambulatory Visit: Payer: Self-pay

## 2019-12-10 VITALS — Ht 63.0 in | Wt 194.0 lb

## 2019-12-10 DIAGNOSIS — Z3042 Encounter for surveillance of injectable contraceptive: Secondary | ICD-10-CM

## 2019-12-10 MED ORDER — MEDROXYPROGESTERONE ACETATE 150 MG/ML IM SUSP
150.0000 mg | Freq: Once | INTRAMUSCULAR | Status: AC
  Administered 2019-12-10: 150 mg via INTRAMUSCULAR

## 2019-12-10 NOTE — Addendum Note (Signed)
Addended by: Federico Flake A on: 12/10/2019 11:01 AM   Modules accepted: Level of Service

## 2019-12-10 NOTE — Progress Notes (Signed)
   NURSE VISIT- INJECTION  SUBJECTIVE:  Crystal Berg is a 24 y.o. G0P0000 female here for a depo injection. She is a GYN patient.   OBJECTIVE:  There were no vitals taken for this visit.  Appears well, in no apparent distress  Injection administered in: Left deltoid  No orders of the defined types were placed in this encounter.   ASSESSMENT: GYN patient Depo Provera for contraception/period management PLAN: Follow-up: in 11-13 weeks for next Depo   Rennis Petty  12/10/2019 10:48 AM

## 2019-12-15 ENCOUNTER — Ambulatory Visit: Payer: Medicaid Other

## 2020-02-26 ENCOUNTER — Ambulatory Visit: Payer: Medicaid Other

## 2020-03-01 ENCOUNTER — Ambulatory Visit (INDEPENDENT_AMBULATORY_CARE_PROVIDER_SITE_OTHER): Payer: BC Managed Care – PPO | Admitting: *Deleted

## 2020-03-01 DIAGNOSIS — Z3042 Encounter for surveillance of injectable contraceptive: Secondary | ICD-10-CM | POA: Diagnosis not present

## 2020-03-01 MED ORDER — MEDROXYPROGESTERONE ACETATE 150 MG/ML IM SUSP
150.0000 mg | Freq: Once | INTRAMUSCULAR | Status: AC
  Administered 2020-03-01: 150 mg via INTRAMUSCULAR

## 2020-03-01 NOTE — Progress Notes (Signed)
   NURSE VISIT- INJECTION  SUBJECTIVE:  Crystal Berg is a 24 y.o. G0P0000 female here for a Depo Provera for contraception/period management. She is a GYN patient.   OBJECTIVE:  There were no vitals taken for this visit.  Appears well, in no apparent distress  Injection administered in: Left deltoid  Meds ordered this encounter  Medications  . medroxyPROGESTERone (DEPO-PROVERA) injection 150 mg    ASSESSMENT: GYN patient Depo Provera for contraception/period management PLAN: Follow-up: in 11-13 weeks for next Depo   Jobe Marker  03/01/2020 11:34 AM

## 2020-05-24 ENCOUNTER — Encounter: Payer: Self-pay | Admitting: Adult Health

## 2020-05-24 ENCOUNTER — Ambulatory Visit (INDEPENDENT_AMBULATORY_CARE_PROVIDER_SITE_OTHER): Payer: BC Managed Care – PPO | Admitting: *Deleted

## 2020-05-24 ENCOUNTER — Other Ambulatory Visit: Payer: Self-pay

## 2020-05-24 DIAGNOSIS — Z3042 Encounter for surveillance of injectable contraceptive: Secondary | ICD-10-CM | POA: Diagnosis not present

## 2020-05-24 DIAGNOSIS — Z308 Encounter for other contraceptive management: Secondary | ICD-10-CM

## 2020-05-24 MED ORDER — MEDROXYPROGESTERONE ACETATE 150 MG/ML IM SUSP
150.0000 mg | Freq: Once | INTRAMUSCULAR | Status: AC
  Administered 2020-05-24: 150 mg via INTRAMUSCULAR

## 2020-05-24 NOTE — Progress Notes (Signed)
   NURSE VISIT- INJECTION  SUBJECTIVE:  Crystal Berg is a 24 y.o. G0P0000 female here for a Depo Provera for contraception/period management. She is a GYN patient.   OBJECTIVE:  There were no vitals taken for this visit.  Appears well, in no apparent distress  Injection administered in: Right deltoid  Meds ordered this encounter  Medications  . medroxyPROGESTERone (DEPO-PROVERA) injection 150 mg    ASSESSMENT: GYN patient Depo Provera for contraception/period management PLAN: Follow-up: in 11-13 weeks for next Depo   Malachy Mood  05/24/2020 11:37 AM

## 2020-08-17 ENCOUNTER — Other Ambulatory Visit: Payer: Self-pay

## 2020-08-17 ENCOUNTER — Ambulatory Visit (INDEPENDENT_AMBULATORY_CARE_PROVIDER_SITE_OTHER): Payer: BC Managed Care – PPO

## 2020-08-17 DIAGNOSIS — Z3042 Encounter for surveillance of injectable contraceptive: Secondary | ICD-10-CM | POA: Diagnosis not present

## 2020-08-17 MED ORDER — MEDROXYPROGESTERONE ACETATE 150 MG/ML IM SUSP
150.0000 mg | Freq: Once | INTRAMUSCULAR | Status: AC
  Administered 2020-08-17: 150 mg via INTRAMUSCULAR

## 2020-08-17 NOTE — Progress Notes (Signed)
   NURSE VISIT- INJECTION  SUBJECTIVE:  Crystal Berg is a 24 y.o. G0P0000 female here for a Depo Provera for contraception/period management. She is a GYN patient.   OBJECTIVE:  There were no vitals taken for this visit.  Appears well, in no apparent distress  Injection administered in: Right deltoid  Meds ordered this encounter  Medications  . medroxyPROGESTERone (DEPO-PROVERA) injection 150 mg    ASSESSMENT: GYN patient Depo Provera for contraception/period management PLAN: Follow-up: in 11-13 weeks for next Depo   Aprel Egelhoff A Stepheny Canal  08/17/2020  11:34 AM   Pt arrived with self supplied Depo prescription in hand. This RN opened the bag that was still stapled shut. The box containing the medication vial was not sealed, but appropriately labeled for the pt. Upon opening the box, the vial cap was off, the vial was empty, and there was medication residue on the stopper, instructions still in box. This RN showed the pt, her mother, and the supervising RN, Debbe Odea. The pharmacy was called and the situation explained. Greig Castilla with [facility] in [place] stated that he would send this office a replacement vial of Depo for the one the pt would receive in the office, so no new charges for the pt would need to be changed.

## 2020-11-08 ENCOUNTER — Other Ambulatory Visit: Payer: Self-pay | Admitting: Adult Health

## 2020-11-09 ENCOUNTER — Other Ambulatory Visit: Payer: Self-pay

## 2020-11-09 ENCOUNTER — Ambulatory Visit (INDEPENDENT_AMBULATORY_CARE_PROVIDER_SITE_OTHER): Payer: BC Managed Care – PPO | Admitting: *Deleted

## 2020-11-09 DIAGNOSIS — Z3042 Encounter for surveillance of injectable contraceptive: Secondary | ICD-10-CM

## 2020-11-09 MED ORDER — MEDROXYPROGESTERONE ACETATE 150 MG/ML IM SUSP
150.0000 mg | Freq: Once | INTRAMUSCULAR | Status: AC
  Administered 2020-11-09: 150 mg via INTRAMUSCULAR

## 2020-11-09 NOTE — Progress Notes (Signed)
   NURSE VISIT- INJECTION  SUBJECTIVE:  Crystal Berg is a 25 y.o. G0P0000 female here for a Depo Provera for contraception/period management. She is a GYN patient.   OBJECTIVE:  There were no vitals taken for this visit.  Appears well, in no apparent distress  Injection administered in: Left deltoid  Meds ordered this encounter  Medications  . medroxyPROGESTERone (DEPO-PROVERA) injection 150 mg    ASSESSMENT: GYN patient Depo Provera for contraception/period management PLAN: Follow-up: in 11-13 weeks for next Depo   Jobe Marker  11/09/2020 10:55 AM

## 2021-01-25 ENCOUNTER — Other Ambulatory Visit: Payer: Self-pay

## 2021-01-25 ENCOUNTER — Ambulatory Visit (INDEPENDENT_AMBULATORY_CARE_PROVIDER_SITE_OTHER): Payer: BC Managed Care – PPO | Admitting: *Deleted

## 2021-01-25 DIAGNOSIS — Z308 Encounter for other contraceptive management: Secondary | ICD-10-CM | POA: Diagnosis not present

## 2021-01-25 MED ORDER — MEDROXYPROGESTERONE ACETATE 150 MG/ML IM SUSP
150.0000 mg | Freq: Once | INTRAMUSCULAR | Status: AC
  Administered 2021-01-25: 150 mg via INTRAMUSCULAR

## 2021-01-25 NOTE — Progress Notes (Signed)
   NURSE VISIT- INJECTION  SUBJECTIVE:  Crystal Berg is a 25 y.o. G0P0000 female here for a Depo Provera for contraception/period management. She is a GYN patient.   OBJECTIVE:  There were no vitals taken for this visit.  Appears well, in no apparent distress  Injection administered in: Right deltoid  Meds ordered this encounter  Medications  . medroxyPROGESTERone (DEPO-PROVERA) injection 150 mg    ASSESSMENT: GYN patient Depo Provera for contraception/period management PLAN: Follow-up: in 11-13 weeks for next Depo   Malachy Mood  01/25/2021 10:48 AM

## 2021-03-01 ENCOUNTER — Ambulatory Visit: Payer: BC Managed Care – PPO

## 2021-03-18 ENCOUNTER — Other Ambulatory Visit: Payer: Self-pay

## 2021-03-18 ENCOUNTER — Ambulatory Visit (INDEPENDENT_AMBULATORY_CARE_PROVIDER_SITE_OTHER): Payer: BC Managed Care – PPO | Admitting: Adult Health

## 2021-03-18 ENCOUNTER — Encounter: Payer: Self-pay | Admitting: Adult Health

## 2021-03-18 ENCOUNTER — Other Ambulatory Visit (HOSPITAL_COMMUNITY)
Admission: RE | Admit: 2021-03-18 | Discharge: 2021-03-18 | Disposition: A | Discharge status: Home / Self Care | Payer: BC Managed Care – PPO | Source: Ambulatory Visit | Attending: Adult Health | Admitting: Adult Health

## 2021-03-18 VITALS — BP 124/80 | HR 83 | Ht 63.0 in | Wt 196.5 lb

## 2021-03-18 DIAGNOSIS — N939 Abnormal uterine and vaginal bleeding, unspecified: Secondary | ICD-10-CM | POA: Insufficient documentation

## 2021-03-18 DIAGNOSIS — R102 Pelvic and perineal pain: Secondary | ICD-10-CM

## 2021-03-18 DIAGNOSIS — Z113 Encounter for screening for infections with a predominantly sexual mode of transmission: Secondary | ICD-10-CM | POA: Diagnosis not present

## 2021-03-18 MED ORDER — IBUPROFEN 800 MG PO TABS: 800.0000 mg | ORAL_TABLET | Freq: Three times a day (TID) | ORAL | 1 refills | Status: DC | PRN

## 2021-03-18 NOTE — Progress Notes (Signed)
  Subjective:     Patient ID: Crystal Berg, female   DOB: [birth year], 25 y.o.   MRN: [id number]  HPI Breindel is a 25 year old white female,single, G0P0, in complaining of bleeding with depo and cramping, no bleeding now. Last depo 01/25/21. PCP is [facility] in [place].  Review of Systems Bleeding with depo, not normal +cramping Had pain with sex No new partners  Reviewed past medical,surgical, social and family history. Reviewed medications and allergies.     Objective:   Physical Exam BP 124/80 (BP Location: Left Arm, Patient Position: Sitting, Cuff Size: Normal)   Pulse 83   Ht 5\' 3"  (1.6 m)   Wt 196 lb 8 oz (89.1 kg)   LMP 03/03/2021 (Approximate)   BMI 34.81 kg/m  Skin warm and dry.Pelvic: external genitalia is normal in appearance no lesions, vagina: creamy discharge without odor,urethra has no lesions or masses noted, cervix:smooth, uterus: normal size, shape and contour, mildly tender, no masses felt, adnexa: no masses or tenderness noted. Bladder is non tender and no masses felt. CV swab obtained.   Fall risk is low  [facility] - 03/18/21 0846       Pregnancy Intention Screening   Does the patient want to become pregnant in the next year? No    Does the patient's partner want to become pregnant in the next year? No    Would the patient like to discuss contraceptive options today? No      Contraception Wrap Up   Current Method Hormonal Injection    End Method Hormonal Injection    Contraception Counseling Provided No            Examination chaperoned by 05/19/21 LPN.  Assessment:     1. Abnormal uterine bleeding (AUB) CV swab sent for GC/CHL,trich,BV and yeast  - Cervicovaginal ancillary only( [place]) - Malachy Mood PELVIC COMPLETE WITH TRANSVAGINAL; Future [place] scheduled for [place] 03/22/21 at 1:30 pm  2. Pelvic cramping CV swab sent and 05/23/21 scheduled - Cervicovaginal ancillary only( [place]) - US PELVIC COMPLETE WITH TRANSVAGINAL; Future Will rx  motrin 800 mg for cramping Meds ordered this encounter  Medications   ibuprofen (ADVIL) 800 MG tablet    Sig: Take 1 tablet (800 mg total) by mouth every 8 (eight) hours as needed.    Dispense:  60 tablet    Refill:  1    Order Specific Question:   Supervising Provider    Answer:   [place], LUTHER H [[id number]]     3. Screening examination for STD (sexually transmitted disease) CV swab sent  - Cervicovaginal ancillary only( [place])     Plan:     Return 03/29/21 for pap and physical and ROS

## 2021-03-20 LAB — CERVICOVAGINAL ANCILLARY ONLY
Bacterial Vaginitis (gardnerella): POSITIVE — AB
Candida Glabrata: NEGATIVE
Candida Vaginitis: NEGATIVE
Chlamydia: NEGATIVE
Comment: NEGATIVE
Comment: NEGATIVE
Comment: NEGATIVE
Comment: NEGATIVE
Comment: NEGATIVE
Comment: NORMAL
Neisseria Gonorrhea: NEGATIVE
Trichomonas: NEGATIVE

## 2021-03-21 ENCOUNTER — Other Ambulatory Visit: Payer: Self-pay | Admitting: Adult Health

## 2021-03-21 MED ORDER — METRONIDAZOLE 500 MG PO TABS: 500.0000 mg | ORAL_TABLET | Freq: Two times a day (BID) | ORAL | 0 refills | Status: DC

## 2021-03-21 NOTE — Progress Notes (Signed)
Vaginal swab +BV will rx flagyl 

## 2021-03-22 ENCOUNTER — Other Ambulatory Visit: Payer: Self-pay

## 2021-03-22 ENCOUNTER — Ambulatory Visit (HOSPITAL_COMMUNITY)
Admission: RE | Admit: 2021-03-22 | Discharge: 2021-03-22 | Disposition: A | Discharge status: Home / Self Care | Payer: BC Managed Care – PPO | Source: Ambulatory Visit | Attending: Adult Health | Admitting: Adult Health

## 2021-03-22 DIAGNOSIS — N939 Abnormal uterine and vaginal bleeding, unspecified: Secondary | ICD-10-CM | POA: Diagnosis not present

## 2021-03-22 DIAGNOSIS — R102 Pelvic and perineal pain: Secondary | ICD-10-CM | POA: Diagnosis present

## 2021-03-29 ENCOUNTER — Other Ambulatory Visit: Payer: BC Managed Care – PPO | Admitting: Adult Health

## 2021-04-19 ENCOUNTER — Other Ambulatory Visit: Payer: Self-pay

## 2021-04-19 ENCOUNTER — Ambulatory Visit (INDEPENDENT_AMBULATORY_CARE_PROVIDER_SITE_OTHER): Payer: BC Managed Care – PPO | Admitting: *Deleted

## 2021-04-19 DIAGNOSIS — Z3042 Encounter for surveillance of injectable contraceptive: Secondary | ICD-10-CM

## 2021-04-19 MED ORDER — MEDROXYPROGESTERONE ACETATE 150 MG/ML IM SUSP
150.0000 mg | Freq: Once | INTRAMUSCULAR | Status: AC
  Administered 2021-04-19: 150 mg via INTRAMUSCULAR

## 2021-04-19 NOTE — Progress Notes (Signed)
   NURSE VISIT- INJECTION  SUBJECTIVE:  Crystal Berg is a 25 y.o. G0P0000 female here for a Depo Provera for contraception/period management. She is a GYN patient.   OBJECTIVE:  There were no vitals taken for this visit.  Appears well, in no apparent distress  Injection administered in: Left deltoid  Meds ordered this encounter  Medications   medroxyPROGESTERone (DEPO-PROVERA) injection 150 mg    ASSESSMENT: GYN patient Depo Provera for contraception/period management PLAN: Follow-up: in 11-13 weeks for next Depo   Annamarie Dawley  04/19/2021 10:57 AM

## 2021-05-10 ENCOUNTER — Other Ambulatory Visit: Payer: BC Managed Care – PPO | Admitting: Adult Health

## 2021-07-12 ENCOUNTER — Ambulatory Visit (INDEPENDENT_AMBULATORY_CARE_PROVIDER_SITE_OTHER): Payer: BC Managed Care – PPO | Admitting: *Deleted

## 2021-07-12 ENCOUNTER — Other Ambulatory Visit: Payer: Self-pay

## 2021-07-12 DIAGNOSIS — Z3042 Encounter for surveillance of injectable contraceptive: Secondary | ICD-10-CM | POA: Diagnosis not present

## 2021-07-12 MED ORDER — MEDROXYPROGESTERONE ACETATE 150 MG/ML IM SUSP
150.0000 mg | Freq: Once | INTRAMUSCULAR | Status: AC
  Administered 2021-07-12: 150 mg via INTRAMUSCULAR

## 2021-07-12 NOTE — Progress Notes (Signed)
   NURSE VISIT- INJECTION  SUBJECTIVE:  Crystal Berg is a 25 y.o. G0P0000 female here for a Depo Provera for contraception/period management. She is a GYN patient.   OBJECTIVE:  There were no vitals taken for this visit.  Appears well, in no apparent distress  Injection administered in: Right deltoid  Meds ordered this encounter  Medications   medroxyPROGESTERone (DEPO-PROVERA) injection 150 mg    ASSESSMENT: GYN patient Depo Provera for contraception/period management PLAN: Follow-up: in 11-13 weeks for next Depo   Annamarie Dawley  07/12/2021 10:04 AM

## 2021-10-02 IMAGING — US US PELVIS COMPLETE WITH TRANSVAGINAL
1 series · 14 of 25 positions shown · non-contrast
Comparison: None

CLINICAL DATA: Abnormal uterine bleeding and cramping, LMP
03/02/2021 abnormal lasting until 03/18/2021



[Series 1: gyn us · 14 of 102 slices shown]
[im 1/102]
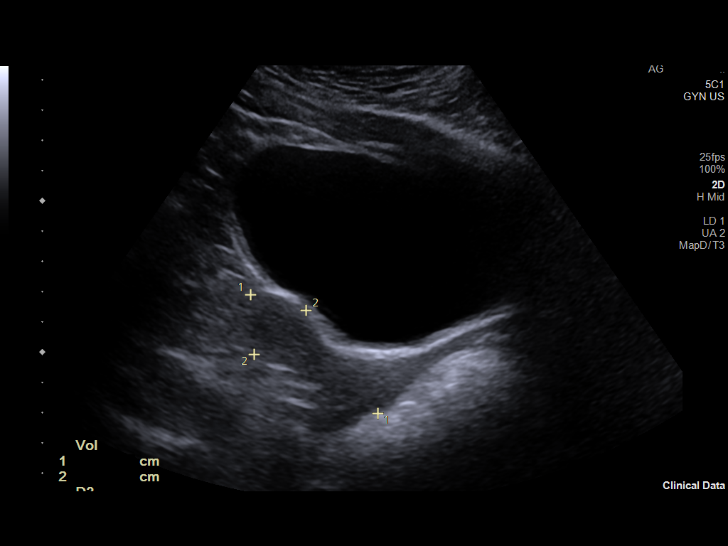
[im 9/102]
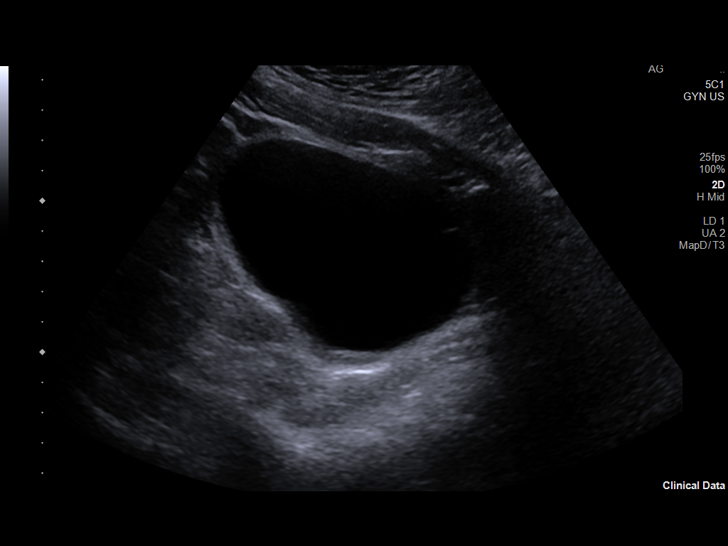
[im 17/102]
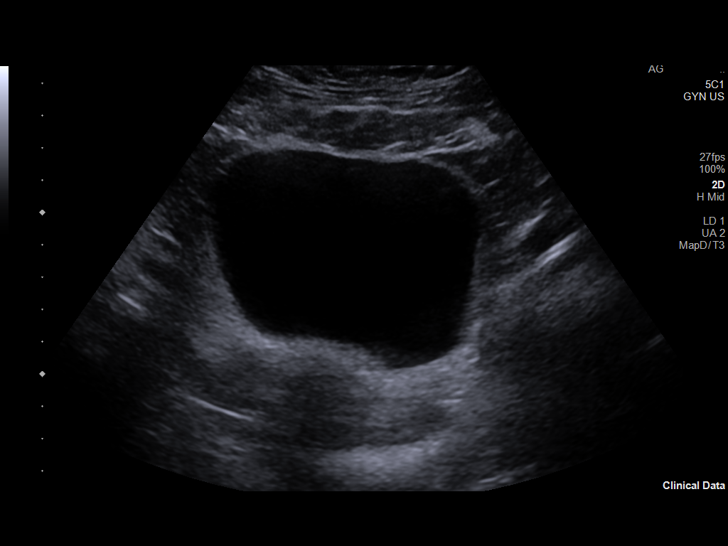
[im 26/102]
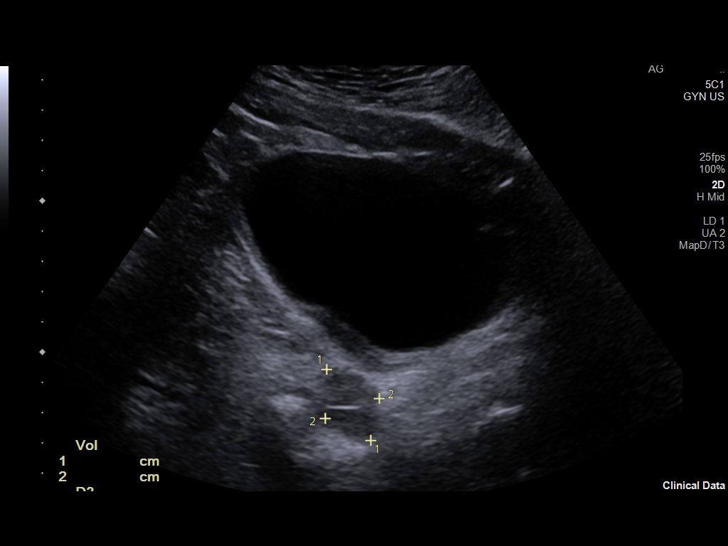
[im 34/102]
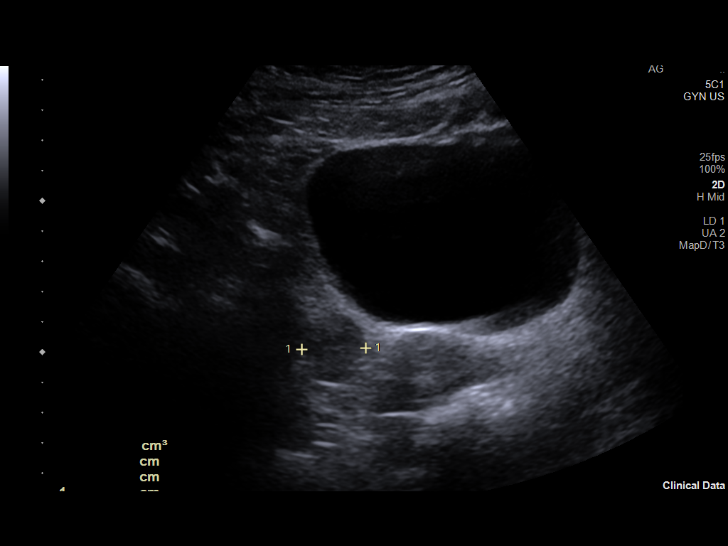
[im 38/102]
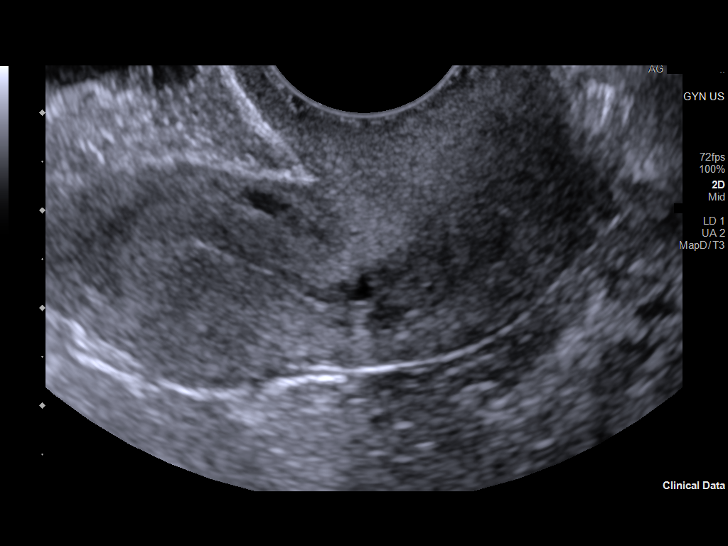
[im 47/102]
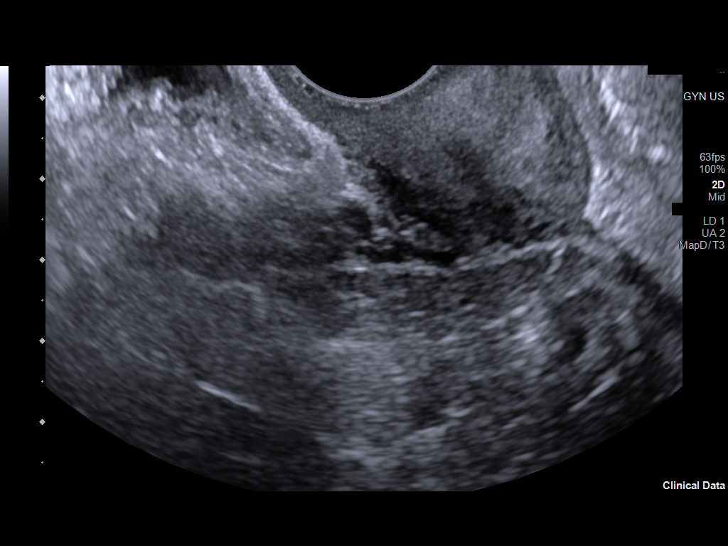
[im 55/102]
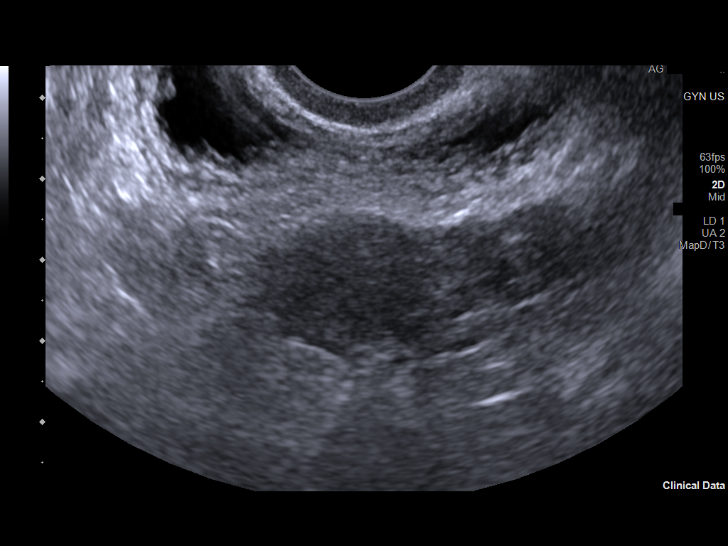
[im 64/102]
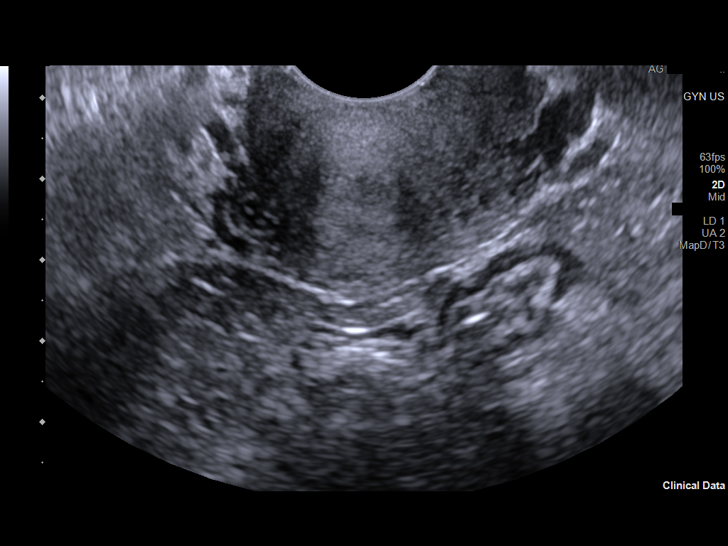
[im 68/102]
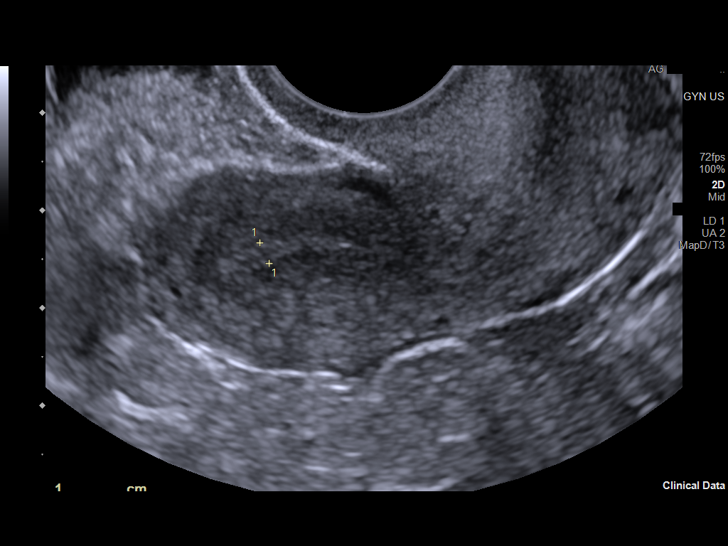
[im 76/102]
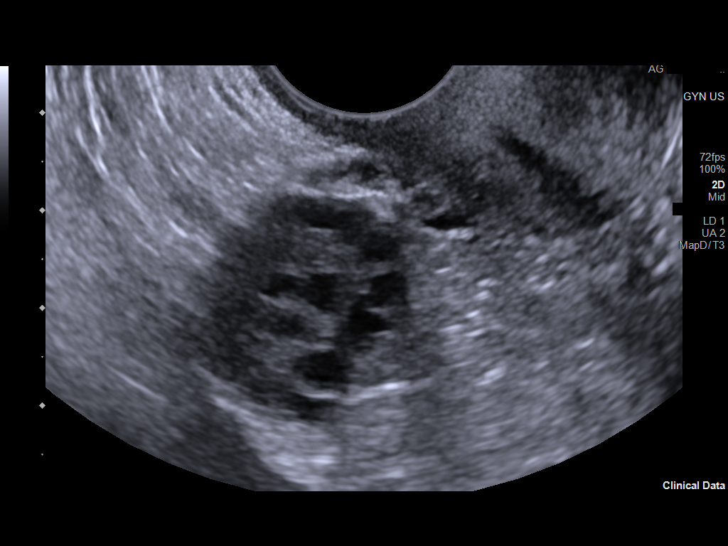
[im 85/102]
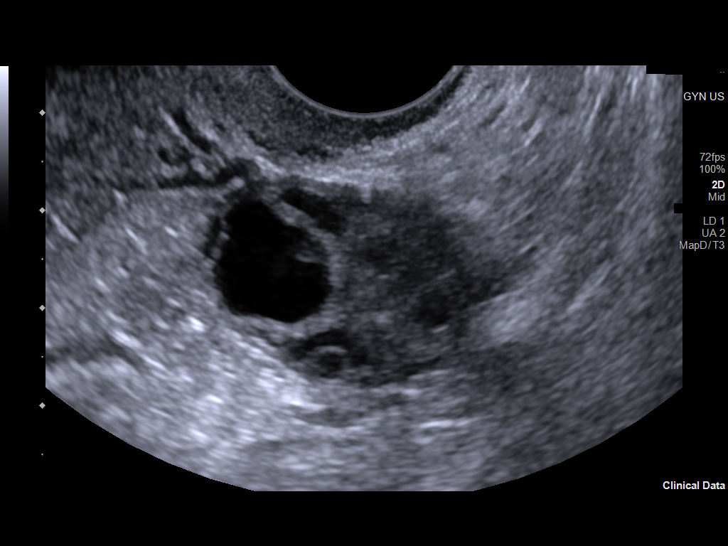
[im 93/102]
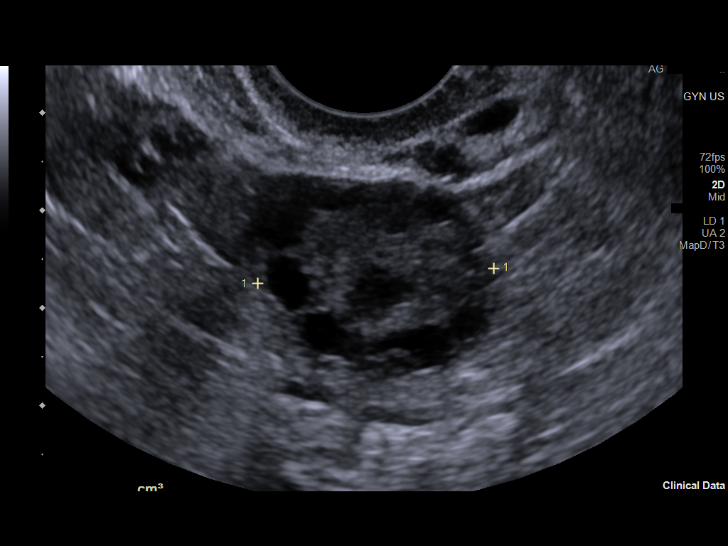
[im 102/102]
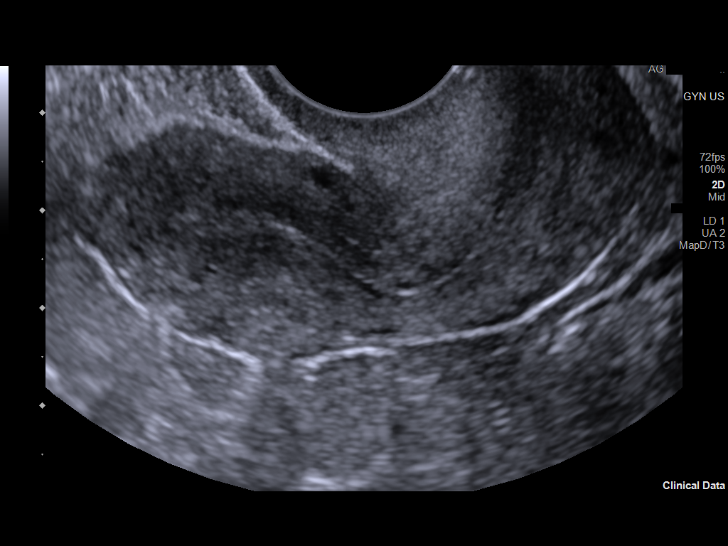

[14 of 25 positions shown; findings below may reference images not displayed]

FINDINGS: Uterus

Measurements: 6.1 x 2.3 x 3.3 cm = volume: 24 mL. Anteverted. Normal
morphology without mass

Endometrium

Thickness: 2 mm.  No endometrial fluid or focal abnormality

Right ovary

Measurements: 2.5 x 2.0 x 1.9 cm = volume: 4.9 mL. Normal morphology
without mass. Numerous follicles present.

Left ovary

Measurements: 2.9 x 2.1 x 2.4 cm = volume: 7.7 mL. Small resolving
corpus luteum; no follow-up imaging recommended. Otherwise
unremarkable.

Other findings

No free pelvic fluid or adnexal masses.
IMPRESSION: No definite pelvic sonographic abnormalities identified.

## 2021-10-04 ENCOUNTER — Ambulatory Visit: Payer: BC Managed Care – PPO

## 2021-10-05 ENCOUNTER — Other Ambulatory Visit: Payer: Self-pay

## 2021-10-05 ENCOUNTER — Ambulatory Visit (INDEPENDENT_AMBULATORY_CARE_PROVIDER_SITE_OTHER): Payer: BC Managed Care – PPO | Admitting: *Deleted

## 2021-10-05 DIAGNOSIS — Z3042 Encounter for surveillance of injectable contraceptive: Secondary | ICD-10-CM | POA: Diagnosis not present

## 2021-10-05 MED ORDER — MEDROXYPROGESTERONE ACETATE 150 MG/ML IM SUSP
150.0000 mg | Freq: Once | INTRAMUSCULAR | Status: AC
  Administered 2021-10-05: 16:00:00 150 mg via INTRAMUSCULAR

## 2021-10-05 NOTE — Progress Notes (Signed)
° °  NURSE VISIT- INJECTION  SUBJECTIVE:  Crystal Berg is a 26 y.o. G0P0000 female here for a Depo Provera for contraception/period management. She is a GYN patient.   OBJECTIVE:  There were no vitals taken for this visit.  Appears well, in no apparent distress  Injection administered in: Left deltoid  Meds ordered this encounter  Medications   medroxyPROGESTERone (DEPO-PROVERA) injection 150 mg    ASSESSMENT: GYN patient Depo Provera for contraception/period management PLAN: Follow-up: in 11-13 weeks for next Depo   [name]  10/05/2021 4:16 PM

## 2021-12-26 ENCOUNTER — Other Ambulatory Visit: Payer: Self-pay | Admitting: Adult Health

## 2021-12-27 ENCOUNTER — Ambulatory Visit (INDEPENDENT_AMBULATORY_CARE_PROVIDER_SITE_OTHER): Payer: BC Managed Care – PPO | Admitting: *Deleted

## 2021-12-27 DIAGNOSIS — Z3042 Encounter for surveillance of injectable contraceptive: Secondary | ICD-10-CM

## 2021-12-27 MED ORDER — MEDROXYPROGESTERONE ACETATE 150 MG/ML IM SUSP
150.0000 mg | Freq: Once | INTRAMUSCULAR | Status: AC
  Administered 2021-12-27: 150 mg via INTRAMUSCULAR

## 2021-12-27 NOTE — Progress Notes (Signed)
? ?  NURSE VISIT- INJECTION ? ?SUBJECTIVE:  ?Crystal Berg is a 26 y.o. G0P0000 female here for a Depo Provera for contraception/period management. She is a GYN patient.  ? ?OBJECTIVE:  ?There were no vitals taken for this visit.  ?Appears well, in no apparent distress ? ?Injection administered in: Left deltoid ? ?Meds ordered this encounter  ?Medications  ? medroxyPROGESTERone (DEPO-PROVERA) injection 150 mg  ? ? ?ASSESSMENT: ?GYN patient Depo Provera for contraception/period management ?PLAN: ?Follow-up:  needs to schedule pap/physical before next depo injection.    ? ?Jobe Marker  ?12/27/2021 ?4:10 PM ? ?

## 2022-01-24 ENCOUNTER — Ambulatory Visit (INDEPENDENT_AMBULATORY_CARE_PROVIDER_SITE_OTHER): Payer: BC Managed Care – PPO | Admitting: Adult Health

## 2022-01-24 ENCOUNTER — Encounter: Payer: Self-pay | Admitting: Adult Health

## 2022-01-24 ENCOUNTER — Other Ambulatory Visit (HOSPITAL_COMMUNITY)
Admission: RE | Admit: 2022-01-24 | Discharge: 2022-01-24 | Disposition: A | Discharge status: Home / Self Care | Payer: BC Managed Care – PPO | Source: Ambulatory Visit | Attending: Adult Health | Admitting: Adult Health

## 2022-01-24 VITALS — BP 138/87 | HR 95 | Ht 64.0 in | Wt 205.0 lb

## 2022-01-24 DIAGNOSIS — Z3009 Encounter for other general counseling and advice on contraception: Secondary | ICD-10-CM | POA: Diagnosis not present

## 2022-01-24 DIAGNOSIS — Z01419 Encounter for gynecological examination (general) (routine) without abnormal findings: Secondary | ICD-10-CM

## 2022-01-24 DIAGNOSIS — Z113 Encounter for screening for infections with a predominantly sexual mode of transmission: Secondary | ICD-10-CM

## 2022-01-24 DIAGNOSIS — Z3042 Encounter for surveillance of injectable contraceptive: Secondary | ICD-10-CM | POA: Diagnosis not present

## 2022-01-24 MED ORDER — MEDROXYPROGESTERONE ACETATE 150 MG/ML IM SUSP: INTRAMUSCULAR | 4 refills | Status: DC

## 2022-01-24 NOTE — Progress Notes (Signed)
Patient ID: Crystal Berg, female   DOB: 06/13/1996, 26 y.o.   MRN: [id number] ?History of Present Illness: ?Crystal Berg is a 26 year old white female,single, G0P0 in for a well woman gyn exam and pap.  ? ?PCP is [place] ? ?Current Medications, Allergies, Past Medical History, Past Surgical History, Family History and Social History were reviewed in [employer] record.   ? ? ?Review of Systems: ?Patient denies any headaches, hearing loss, fatigue, blurred vision, shortness of breath, chest pain, abdominal pain, problems with bowel movements, urination, or intercourse. No joint pain or mood swings.  ?No periods on depo ? ? ?Physical Exam:BP 138/87 (BP Location: Left Arm, Patient Position: Sitting, Cuff Size: Large)   Pulse 95   Ht 5\' 4"  (1.626 m)   Wt 205 lb (93 kg)   LMP  (LMP Unknown)   BMI 35.19 kg/m?   ?General:  Well developed, well nourished, no acute distress ?Skin:  Warm and dry ?Neck:  Midline trachea, normal thyroid, good ROM, no lymphadenopathy ?Lungs; Clear to auscultation bilaterally ?Breast:  No dominant palpable mass, retraction, or nipple discharge, has  regular irregularities UOQ bilaterally ?Cardiovascular: Regular rate and rhythm ?Abdomen:  Soft, non tender, no hepatosplenomegaly ?Pelvic:  External genitalia is normal in appearance, no lesions.  The vagina is normal in appearance. Urethra has no lesions or masses. The cervix is nulliparous, pap with GC/CHL and HR HPV genotyping performed.  Uterus is felt to be normal size, shape, and contour.  No adnexal masses or tenderness noted.Bladder is non tender, no masses felt. ?Extremities/musculoskeletal:  No swelling or varicosities noted, no clubbing or cyanosis ?Psych:  No mood changes, alert and cooperative,seems happy ?AA is 2 ?Fall risk is low ? ?  01/24/2022  ?  2:53 PM 08/08/2017  ? 10:22 AM 05/10/2017  ?  3:05 PM  ?Depression screen PHQ 2/9  ?Decreased Interest 0 0 0  ?Down, Depressed, Hopeless 0 0 0   ?PHQ - 2 Score 0 0 0  ?Altered sleeping 0    ?Tired, decreased energy 1    ?Change in appetite 0    ?Feeling bad or failure about yourself  0    ?Trouble concentrating 0    ?Moving slowly or fidgety/restless 0    ?Suicidal thoughts 0    ?PHQ-9 Score 1    ?  ? ?  01/24/2022  ?  2:53 PM  ?GAD 7 : Generalized Anxiety Score  ?Nervous, Anxious, on Edge 0  ?Control/stop worrying 0  ?Worry too much - different things 0  ?Trouble relaxing 0  ?Restless 0  ?Easily annoyed or irritable 0  ?Afraid - awful might happen 0  ?Total GAD 7 Score 0  ? ?  ? Upstream - 01/24/22 1453   ? ?  ? Pregnancy Intention Screening  ? Does the patient want to become pregnant in the next year? Unsure   ? Does the patient's partner want to become pregnant in the next year? Unsure   ? Would the patient like to discuss contraceptive options today? Yes   ?  ? Contraception Wrap Up  ? Current Method Hormonal Injection   ? End Method Hormonal Injection   ? Contraception Counseling Provided Yes   ? ?  ?  ? ?  ? Examination chaperoned by Crystal Scot RN ? ?Impression and Plan: ?1. Encounter for gynecological examination with Papanicolaou smear of cervix ?Pap sent ?Physical in 1 year ?Pap in 3 years if normal ?Labs at  PCP ? ?- Cytology - PAP ? ?2. Family planning ?On depo for now ? ?3. Screening examination for STD (sexually transmitted disease) ?Check labs for STD ?- Hepatitis C antibody ?- Hepatitis B surface antigen ?- HIV Antibody (routine testing w rflx) ?- RPR ? ?4. Encounter for surveillance of injectable contraceptive ?Will continue depo for now, will let me know when wants to stop  ?Has appt 03/21/22 for next depo.  ?Meds ordered this encounter  ?Medications  ? medroxyPROGESTERone (DEPO-PROVERA) 150 MG/ML injection  ?  Sig: 150 mg/1 ml IM every 12 weeks in office  ?  Dispense:  1 mL  ?  Refill:  4  ?  Order Specific Question:   Supervising Provider  ?  Answer:   Tania Ade H [[id number]]  ?  ? ? ? ?  ?  ?

## 2022-01-25 LAB — HIV ANTIBODY (ROUTINE TESTING W REFLEX): HIV Screen 4th Generation wRfx: NONREACTIVE

## 2022-01-25 LAB — RPR: RPR Ser Ql: NONREACTIVE

## 2022-01-25 LAB — HEPATITIS C ANTIBODY: Hep C Virus Ab: NONREACTIVE

## 2022-01-25 LAB — HEPATITIS B SURFACE ANTIGEN: Hepatitis B Surface Ag: NEGATIVE

## 2022-01-27 LAB — CYTOLOGY - PAP
Adequacy: ABSENT
Chlamydia: NEGATIVE
Comment: NEGATIVE
Comment: NEGATIVE
Comment: NORMAL
Diagnosis: NEGATIVE
High risk HPV: NEGATIVE
Neisseria Gonorrhea: NEGATIVE

## 2022-03-21 ENCOUNTER — Ambulatory Visit (INDEPENDENT_AMBULATORY_CARE_PROVIDER_SITE_OTHER): Payer: BC Managed Care – PPO | Admitting: *Deleted

## 2022-03-21 DIAGNOSIS — Z3042 Encounter for surveillance of injectable contraceptive: Secondary | ICD-10-CM | POA: Diagnosis not present

## 2022-03-21 MED ORDER — MEDROXYPROGESTERONE ACETATE 150 MG/ML IM SUSP
150.0000 mg | Freq: Once | INTRAMUSCULAR | Status: AC
  Administered 2022-03-21: 150 mg via INTRAMUSCULAR

## 2022-03-21 NOTE — Progress Notes (Signed)
   NURSE VISIT- INJECTION  SUBJECTIVE:  Crystal Berg is a 26 y.o. G0P0000 female here for a Depo Provera for contraception/period management. She is a GYN patient.   OBJECTIVE:  There were no vitals taken for this visit.  Appears well, in no apparent distress  Injection administered in: Left deltoid  Meds ordered this encounter  Medications   medroxyPROGESTERone (DEPO-PROVERA) injection 150 mg    ASSESSMENT: GYN patient Depo Provera for contraception/period management PLAN: Follow-up: in 11-13 weeks for next Depo   Malachy Mood  03/21/2022 3:31 PM

## 2022-06-13 ENCOUNTER — Ambulatory Visit: Payer: BC Managed Care – PPO

## 2022-06-15 ENCOUNTER — Ambulatory Visit (INDEPENDENT_AMBULATORY_CARE_PROVIDER_SITE_OTHER): Payer: BC Managed Care – PPO | Admitting: *Deleted

## 2022-06-15 DIAGNOSIS — Z3042 Encounter for surveillance of injectable contraceptive: Secondary | ICD-10-CM

## 2022-06-15 MED ORDER — MEDROXYPROGESTERONE ACETATE 150 MG/ML IM SUSP
150.0000 mg | Freq: Once | INTRAMUSCULAR | Status: AC
  Administered 2022-06-15: 150 mg via INTRAMUSCULAR

## 2022-06-15 NOTE — Progress Notes (Signed)
   NURSE VISIT- INJECTION  SUBJECTIVE:  Crystal Berg is a 26 y.o. G0P0000 female here for a Depo Provera for contraception/period management. She is a GYN patient.   OBJECTIVE:  There were no vitals taken for this visit.  Appears well, in no apparent distress  Injection administered in: Left deltoid  Meds ordered this encounter  Medications   medroxyPROGESTERone (DEPO-PROVERA) injection 150 mg    ASSESSMENT: GYN patient Depo Provera for contraception/period management PLAN: Follow-up: in 11-13 weeks for next Depo   Levy Pupa  06/15/2022 3:25 PM

## 2022-09-07 ENCOUNTER — Ambulatory Visit (INDEPENDENT_AMBULATORY_CARE_PROVIDER_SITE_OTHER): Payer: BC Managed Care – PPO | Admitting: *Deleted

## 2022-09-07 DIAGNOSIS — Z3042 Encounter for surveillance of injectable contraceptive: Secondary | ICD-10-CM | POA: Diagnosis not present

## 2022-09-07 MED ORDER — MEDROXYPROGESTERONE ACETATE 150 MG/ML IM SUSP
150.0000 mg | Freq: Once | INTRAMUSCULAR | Status: AC
  Administered 2022-09-07: 150 mg via INTRAMUSCULAR

## 2022-09-07 NOTE — Progress Notes (Signed)
   NURSE VISIT- INJECTION  SUBJECTIVE:  Crystal Berg is a 26 y.o. G0P0000 female here for a Depo Provera for contraception/period management. She is a GYN patient.   OBJECTIVE:  There were no vitals taken for this visit.  Appears well, in no apparent distress  Injection administered in: Left deltoid  Meds ordered this encounter  Medications   medroxyPROGESTERone (DEPO-PROVERA) injection 150 mg    ASSESSMENT: GYN patient Depo Provera for contraception/period management PLAN: Follow-up: in 11-13 weeks for next Depo   Jobe Marker  09/07/2022 2:48 PM

## 2022-11-30 ENCOUNTER — Ambulatory Visit (INDEPENDENT_AMBULATORY_CARE_PROVIDER_SITE_OTHER): Payer: BC Managed Care – PPO | Admitting: *Deleted

## 2022-11-30 DIAGNOSIS — Z3042 Encounter for surveillance of injectable contraceptive: Secondary | ICD-10-CM

## 2022-11-30 MED ORDER — MEDROXYPROGESTERONE ACETATE 150 MG/ML IM SUSY: 150.0000 mg | PREFILLED_SYRINGE | Freq: Once | INTRAMUSCULAR | Status: DC

## 2022-11-30 MED ORDER — MEDROXYPROGESTERONE ACETATE 150 MG/ML IM SUSY
150.0000 mg | PREFILLED_SYRINGE | Freq: Once | INTRAMUSCULAR | Status: AC
  Administered 2022-11-30: 150 mg via INTRAMUSCULAR

## 2022-11-30 NOTE — Progress Notes (Signed)
   NURSE VISIT- INJECTION  SUBJECTIVE:  Crystal Berg is a 27 y.o. G0P0000 female here for a Depo Provera for contraception/period management. She is a GYN patient.   OBJECTIVE:  There were no vitals taken for this visit.  Appears well, in no apparent distress  Injection administered in: Left deltoid  Meds ordered this encounter  Medications   DISCONTD: medroxyPROGESTERone Acetate SUSY 150 mg   medroxyPROGESTERone Acetate SUSY 150 mg    ASSESSMENT: GYN patient Depo Provera for contraception/period management PLAN: Follow-up: in 11-13 weeks for next Depo   Crystal Berg  11/30/2022 3:43 PM

## 2023-02-01 ENCOUNTER — Encounter: Payer: Self-pay | Admitting: Adult Health

## 2023-02-01 ENCOUNTER — Ambulatory Visit (INDEPENDENT_AMBULATORY_CARE_PROVIDER_SITE_OTHER): Payer: BC Managed Care – PPO | Admitting: Adult Health

## 2023-02-01 VITALS — BP 135/91 | HR 100 | Ht 63.0 in | Wt 203.0 lb

## 2023-02-01 DIAGNOSIS — Z01419 Encounter for gynecological examination (general) (routine) without abnormal findings: Secondary | ICD-10-CM | POA: Diagnosis not present

## 2023-02-01 NOTE — Progress Notes (Signed)
Patient ID: Crystal Berg, female   DOB: Jan 03, 1996, 27 y.o.   MRN: [id number] History of Present Illness: Amir is a 27 year old white female,single, G0P0, in for a well woman gyn exam, she had last depo 11/30/22 and wants to stop for now. She is a Production designer, theatre/television/film at [employer].   Last pap was negative HPV, NILM 01/24/22.  PCP is Erskine Speed NP    Current Medications, Allergies, Past Medical History, Past Surgical History, Family History and Social History were reviewed in [employer] record.     Review of Systems: Patient denies any headaches, hearing loss, fatigue, blurred vision, shortness of breath, chest pain, abdominal pain, problems with bowel movements, urination, or intercourse. No joint pain or mood swings.     Physical Exam:BP (!) 135/91 (BP Location: Left Arm, Patient Position: Sitting, Cuff Size: Normal)   Pulse 100   Ht 5\' 3"  (1.6 m)   Wt 203 lb (92.1 kg)   BMI 35.96 kg/m   General:  Well developed, well nourished, no acute distress Skin:  Warm and dry Neck:  Midline trachea, normal thyroid, good ROM, no lymphadenopathy Lungs; Clear to auscultation bilaterally Breast:  No dominant palpable mass, retraction, or nipple discharge Cardiovascular: Regular rate and rhythm Abdomen:  Soft, non tender, no hepatosplenomegaly Pelvic:  External genitalia is normal in appearance, no lesions.  The vagina is normal in appearance. Urethra has no lesions or masses. The cervix is smooth.  Uterus is felt to be normal size, shape, and contour.  No adnexal masses or tenderness noted.Bladder is non tender, no masses felt. Extremities/musculoskeletal:  No swelling or varicosities noted, no clubbing or cyanosis Psych:  No mood changes, alert and cooperative,seems happy AA is 0 Fall risk is low    02/01/2023    3:12 PM 01/24/2022    2:53 PM 08/08/2017   10:22 AM  Depression screen PHQ 2/9  Decreased Interest 0 0 0  Down, Depressed, Hopeless 0 0 0  PHQ - 2 Score 0 0 0   Altered sleeping 1 0   Tired, decreased energy 1 1   Change in appetite 0 0   Feeling bad or failure about yourself  0 0   Trouble concentrating 0 0   Moving slowly or fidgety/restless 0 0   Suicidal thoughts 0 0   PHQ-9 Score 2 1        02/01/2023    3:13 PM 01/24/2022    2:53 PM  GAD 7 : Generalized Anxiety Score  Nervous, Anxious, on Edge 0 0  Control/stop worrying 0 0  Worry too much - different things 0 0  Trouble relaxing 0 0  Restless 0 0  Easily annoyed or irritable 0 0  Afraid - awful might happen 0 0  Total GAD 7 Score 0 0    Upstream - 02/01/23 1502       Pregnancy Intention Screening   Does the patient want to become pregnant in the next year? Ok Either Way    Does the patient's partner want to become pregnant in the next year? Ok Either Way    Would the patient like to discuss contraceptive options today? Yes      Contraception Wrap Up   Current Method Hormonal Injection    End Method No Contraception Precautions    Contraception Counseling Provided Yes            Examination chaperoned by Tish RN     Impression and Plan: 1. Encounter for  well woman exam with routine gynecological exam Pap in 2026 Physical in 1 year Labs with PCP Take OTC PNV with folic acid

## 2023-02-22 ENCOUNTER — Ambulatory Visit: Payer: BC Managed Care – PPO

## 2024-02-07 ENCOUNTER — Ambulatory Visit (INDEPENDENT_AMBULATORY_CARE_PROVIDER_SITE_OTHER): Payer: Self-pay | Admitting: Adult Health

## 2024-02-07 ENCOUNTER — Encounter: Payer: Self-pay | Admitting: Adult Health

## 2024-02-07 VITALS — BP 125/89 | HR 75 | Ht 63.0 in | Wt 210.5 lb

## 2024-02-07 DIAGNOSIS — Z01419 Encounter for gynecological examination (general) (routine) without abnormal findings: Secondary | ICD-10-CM

## 2024-02-07 DIAGNOSIS — Z319 Encounter for procreative management, unspecified: Secondary | ICD-10-CM

## 2024-02-07 DIAGNOSIS — Z1331 Encounter for screening for depression: Secondary | ICD-10-CM

## 2024-02-07 NOTE — Progress Notes (Signed)
 Patient ID: Crystal Berg, female   DOB: Jun 08, 1996, 28 y.o.   MRN: [id number] History of Present Illness: Crystal Berg is a 28 year old white female,engaged, G0P0 in for a well woman gyn exam. She is getting married 04/19/24 and has been trying about 3 months to get pregnant. Periods are regular.     Component Value Date/Time   DIAGPAP  01/24/2022 1504    - Negative for intraepithelial lesion or malignancy (NILM)   DIAGPAP  08/08/2017 0000    NEGATIVE FOR INTRAEPITHELIAL LESIONS OR MALIGNANCY.   HPVHIGH Negative 01/24/2022 1504   ADEQPAP  01/24/2022 1504    Satisfactory for evaluation; transformation zone component ABSENT.   ADEQPAP  08/08/2017 0000    Satisfactory for evaluation  endocervical/transformation zone component ABSENT.    PCP is Crystal Adams FNP   Current Medications, Allergies, Past Medical History, Past Surgical History, Family History and Social History were reviewed in [employer] record.     Review of Systems: Patient denies any headaches, hearing loss, fatigue, blurred vision, shortness of breath, chest pain, abdominal pain, problems with bowel movements, urination, or intercourse. No joint pain or mood swings.  Periods are regular    Physical Exam:BP 125/89 (BP Location: Right Arm, Patient Position: Sitting, Cuff Size: Large)   Pulse 75   Ht 5\' 3"  (1.6 m)   Wt 210 lb 8 oz (95.5 kg)   LMP 01/27/2024 (Exact Date)   BMI 37.29 kg/m   General:  Well developed, well nourished, no acute distress Skin:  Warm and dry Neck:  Midline trachea, normal thyroid, good ROM, no lymphadenopathy Lungs; Clear to auscultation bilaterally Breast:  No dominant palpable mass, retraction, or nipple discharge Cardiovascular: Regular rate and rhythm Abdomen:  Soft, non tender, no hepatosplenomegaly Pelvic:  External genitalia is normal in appearance, no lesions.  The vagina is normal in appearance. Urethra has no lesions or masses. The cervix is everted at os.  Uterus  is felt to be normal size, shape, and contour.  No adnexal masses or tenderness noted.Bladder is non tender, no masses felt. Extremities/musculoskeletal:  No swelling or varicosities noted, no clubbing or cyanosis Psych:  No mood changes, alert and cooperative,seems happy AA is 0 Fall risk is low    02/07/2024   11:40 AM 02/01/2023    3:12 PM 01/24/2022    2:53 PM  Depression screen PHQ 2/9  Decreased Interest 0 0 0  Down, Depressed, Hopeless 0 0 0  PHQ - 2 Score 0 0 0  Altered sleeping 0 1 0  Tired, decreased energy 1 1 1   Change in appetite 0 0 0  Feeling bad or failure about yourself  0 0 0  Trouble concentrating 0 0 0  Moving slowly or fidgety/restless 0 0 0  Suicidal thoughts 0 0 0  PHQ-9 Score 1 2 1        02/07/2024   11:40 AM 02/01/2023    3:13 PM 01/24/2022    2:53 PM  GAD 7 : Generalized Anxiety Score  Nervous, Anxious, on Edge 0 0 0  Control/stop worrying 0 0 0  Worry too much - different things 0 0 0  Trouble relaxing 0 0 0  Restless 0 0 0  Easily annoyed or irritable 0 0 0  Afraid - awful might happen 0 0 0  Total GAD 7 Score 0 0 0    Upstream - 02/07/24 1137       Pregnancy Intention Screening   Does the patient want  to become pregnant in the next year? Yes    Does the patient's partner want to become pregnant in the next year? Yes    Would the patient like to discuss contraceptive options today? No      Contraception Wrap Up   Current Method Pregnant/Seeking Pregnancy    End Method Pregnant/Seeking Pregnancy    Contraception Counseling Provided No              Examination chaperoned by Alphonso Aschoff LPN   Impression and plan: 1. Encounter for well woman exam with routine gynecological exam (Primary) Pap and physical in 1 year   2. Patient desires pregnancy Taking PNV Discussed timing of sex Can check progesterone level with next cycle if desired, check with [facility] on price, has miss placed insurance card  Talk with PCP about stopping lipitor  and changing BP meds
